# Patient Record
Sex: Male | Born: 1960
Health system: Southern US, Community
[De-identification: ages and names within clinical notes are randomized; demographics above are authoritative.]

## PROBLEM LIST (undated history)

## (undated) DIAGNOSIS — R001 Bradycardia, unspecified: Secondary | ICD-10-CM

## (undated) DIAGNOSIS — C61 Malignant neoplasm of prostate: Secondary | ICD-10-CM

## (undated) DIAGNOSIS — G4730 Sleep apnea, unspecified: Secondary | ICD-10-CM

## (undated) DIAGNOSIS — M1990 Unspecified osteoarthritis, unspecified site: Secondary | ICD-10-CM

## (undated) DIAGNOSIS — T4145XA Adverse effect of unspecified anesthetic, initial encounter: Secondary | ICD-10-CM

## (undated) DIAGNOSIS — Z8489 Family history of other specified conditions: Secondary | ICD-10-CM

## (undated) DIAGNOSIS — R112 Nausea with vomiting, unspecified: Secondary | ICD-10-CM

## (undated) DIAGNOSIS — Z87442 Personal history of urinary calculi: Secondary | ICD-10-CM

## (undated) DIAGNOSIS — K429 Umbilical hernia without obstruction or gangrene: Secondary | ICD-10-CM

## (undated) DIAGNOSIS — K219 Gastro-esophageal reflux disease without esophagitis: Secondary | ICD-10-CM

## (undated) DIAGNOSIS — Z9889 Other specified postprocedural states: Secondary | ICD-10-CM

## (undated) DIAGNOSIS — R350 Frequency of micturition: Secondary | ICD-10-CM

## (undated) DIAGNOSIS — Z889 Allergy status to unspecified drugs, medicaments and biological substances status: Secondary | ICD-10-CM

## (undated) DIAGNOSIS — R519 Headache, unspecified: Secondary | ICD-10-CM

## (undated) DIAGNOSIS — T8859XA Other complications of anesthesia, initial encounter: Secondary | ICD-10-CM

## (undated) HISTORY — PX: PROSTATE BIOPSY: SHX241

## (undated) HISTORY — PX: LITHOTRIPSY: SUR834

## (undated) HISTORY — PX: VASECTOMY: SHX75

---

## 2011-01-21 ENCOUNTER — Emergency Department (HOSPITAL_COMMUNITY)
Admission: EM | Admit: 2011-01-21 | Discharge: 2011-01-21 | Disposition: A | Discharge status: Home / Self Care | Payer: 59 | Source: Home / Self Care | Attending: Family Medicine | Admitting: Family Medicine

## 2011-01-21 ENCOUNTER — Emergency Department (INDEPENDENT_AMBULATORY_CARE_PROVIDER_SITE_OTHER): Payer: 59

## 2011-01-21 DIAGNOSIS — J069 Acute upper respiratory infection, unspecified: Secondary | ICD-10-CM

## 2011-01-21 MED ORDER — PSEUDOEPHEDRINE HCL ER 120 MG PO TB12: 120.0000 mg | ORAL_TABLET | Freq: Two times a day (BID) | ORAL | Status: DC

## 2011-01-21 NOTE — ED Provider Notes (Signed)
Medical screening examination/treatment/procedure(s) were performed by non-physician practitioner and as supervising physician I was immediately available for consultation/collaboration.   KINDL,JAMES DOUGLAS MD.    James Douglas Kindl, MD 01/21/11 2005 

## 2011-01-21 NOTE — ED Provider Notes (Signed)
History     CSN: [id number] Arrival date & time: 01/21/2011  9:18 AM   First MD Initiated Contact with Patient 01/21/11 0919      No chief complaint on file.   (Consider location/radiation/quality/duration/timing/severity/associated sxs/prior treatment) Patient is a 50 y.o. male presenting with cough.  Cough This is a new problem. Episode onset: 2.5 weeks ago. The problem occurs every few minutes. The problem has been gradually worsening. The cough is productive of sputum (thick yellow sputum). There has been no fever. Associated symptoms include chills, sore throat and wheezing. Pertinent negatives include no chest pain, no ear congestion, no ear pain, no rhinorrhea and no myalgias. Associated symptoms comments: Back pain . Treatments tried: tx by pcp at start of illness with z-pack, it helped a little but when finished the medicine, sx returned  The treatment provided no relief. He is not a smoker. His past medical history is significant for asthma.    No past medical history on file.  No past surgical history on file.  No family history on file.  History  Substance Use Topics  . Smoking status: Not on file  . Smokeless tobacco: Not on file  . Alcohol Use: Not on file      Review of Systems  Constitutional: Positive for chills. Negative for fever.  HENT: Positive for congestion, sore throat and postnasal drip. Negative for ear pain, rhinorrhea, trouble swallowing and sinus pressure.   Respiratory: Positive for cough and wheezing.   Cardiovascular: Negative for chest pain.  Musculoskeletal: Negative for myalgias.    Allergies  Review of patient's allergies indicates not on file.  Home Medications  No current outpatient prescriptions on file.  BP 121/74  Pulse 97  Temp(Src) 97.4 F (36.3 C) (Oral)  Resp 17  SpO2 98%  Physical Exam  Constitutional: He appears well-developed and well-nourished. No distress.  HENT:  Right Ear: Tympanic membrane, external ear and  ear canal normal.  Left Ear: Tympanic membrane, external ear and ear canal normal.  Nose: Mucosal edema present.  Mouth/Throat: Mucous membranes are normal. Posterior oropharyngeal erythema present. No oropharyngeal exudate or posterior oropharyngeal edema.       White nasal discharge  Cardiovascular: Normal rate and regular rhythm.   Pulmonary/Chest: Effort normal and breath sounds normal.       cough  Lymphadenopathy:       Head (right side): No submandibular adenopathy present.       Head (left side): No submandibular adenopathy present.    ED Course  Procedures (including critical care time)  Labs Reviewed - No data to display No results found.   No diagnosis found.    MDM  No pna on xray, mild peribronchial thickening        Cathlyn Parsons, NP 01/21/11 1026

## 2013-02-27 DIAGNOSIS — G4730 Sleep apnea, unspecified: Secondary | ICD-10-CM

## 2013-02-27 HISTORY — DX: Sleep apnea, unspecified: G47.30

## 2013-07-17 ENCOUNTER — Encounter: Payer: Self-pay | Admitting: Gastroenterology

## 2014-12-03 ENCOUNTER — Institutional Professional Consult (permissible substitution): Payer: 59 | Admitting: Pulmonary Disease

## 2015-03-16 MED FILL — FLUTICASONE PROP 50 MCG SPR: 50 | 89 days supply | Qty: 48 | Fill #2

## 2015-03-16 MED FILL — CYCLOBENZAPRINE 10 MG TAB: 10 | 30 days supply | Qty: 90 | Fill #1

## 2015-03-25 DIAGNOSIS — G4733 Obstructive sleep apnea (adult) (pediatric): Secondary | ICD-10-CM | POA: Diagnosis not present

## 2015-04-28 MED FILL — CYCLOBENZAPRINE 10 MG TAB: 10 | 30 days supply | Qty: 90 | Fill #2

## 2015-04-28 MED FILL — OMEPRAZOLE DR 20 MG CAPSULE: 20 | 90 days supply | Qty: 180 | Fill #1

## 2015-04-28 MED FILL — GABAPENTIN 300 MG CAPSULE: 300 | 90 days supply | Qty: 180 | Fill #1

## 2015-05-06 MED FILL — TAMSULOSIN HCL 0.4 MG CAP: 0.4 | 90 days supply | Qty: 90 | Fill #0

## 2015-05-06 MED FILL — MONTELUKAST SOD 10 MG TAB: 10 | 90 days supply | Qty: 90 | Fill #0

## 2015-06-15 DIAGNOSIS — G4733 Obstructive sleep apnea (adult) (pediatric): Secondary | ICD-10-CM | POA: Diagnosis not present

## 2015-06-24 DIAGNOSIS — G4733 Obstructive sleep apnea (adult) (pediatric): Secondary | ICD-10-CM | POA: Diagnosis not present

## 2015-07-13 DIAGNOSIS — G4733 Obstructive sleep apnea (adult) (pediatric): Secondary | ICD-10-CM | POA: Diagnosis not present

## 2015-07-13 DIAGNOSIS — Z9989 Dependence on other enabling machines and devices: Secondary | ICD-10-CM | POA: Diagnosis not present

## 2015-07-13 DIAGNOSIS — J3089 Other allergic rhinitis: Secondary | ICD-10-CM | POA: Diagnosis not present

## 2015-07-27 MED FILL — GABAPENTIN 300 MG CAPSULE: 300 | 90 days supply | Qty: 180 | Fill #2

## 2015-07-27 MED FILL — FLUTICASONE PROP 50 MCG SPR: 50 | 89 days supply | Qty: 48 | Fill #3

## 2015-07-27 MED FILL — TAMSULOSIN HCL 0.4 MG CAP: 0.4 | 90 days supply | Qty: 90 | Fill #1

## 2015-07-27 MED FILL — MONTELUKAST SOD 10 MG TAB: 10 | 90 days supply | Qty: 90 | Fill #1

## 2015-07-27 MED FILL — OMEPRAZOLE DR 20 MG CAPSULE: 20 | 90 days supply | Qty: 180 | Fill #2

## 2015-08-10 DIAGNOSIS — L820 Inflamed seborrheic keratosis: Secondary | ICD-10-CM | POA: Diagnosis not present

## 2015-08-10 DIAGNOSIS — D225 Melanocytic nevi of trunk: Secondary | ICD-10-CM | POA: Diagnosis not present

## 2015-08-10 DIAGNOSIS — D485 Neoplasm of uncertain behavior of skin: Secondary | ICD-10-CM | POA: Diagnosis not present

## 2015-08-10 DIAGNOSIS — D1801 Hemangioma of skin and subcutaneous tissue: Secondary | ICD-10-CM | POA: Diagnosis not present

## 2015-09-24 DIAGNOSIS — G4733 Obstructive sleep apnea (adult) (pediatric): Secondary | ICD-10-CM | POA: Diagnosis not present

## 2015-09-27 DIAGNOSIS — H5213 Myopia, bilateral: Secondary | ICD-10-CM | POA: Diagnosis not present

## 2015-10-08 MED FILL — CYCLOBENZAPRINE 10 MG TAB: 10 | 30 days supply | Qty: 90 | Fill #0

## 2015-10-26 MED FILL — GABAPENTIN 300 MG CAPSULE: 300 | 90 days supply | Qty: 180 | Fill #3

## 2015-10-26 MED FILL — TAMSULOSIN HCL 0.4 MG CAP: 0.4 | 90 days supply | Qty: 90 | Fill #2

## 2015-10-26 MED FILL — MONTELUKAST SOD 10 MG TAB: 10 | 90 days supply | Qty: 90 | Fill #2

## 2015-10-26 MED FILL — FLUTICASONE PROP 50 MCG SPR: 50 | 90 days supply | Qty: 48 | Fill #0

## 2015-10-26 MED FILL — OMEPRAZOLE DR 20 MG CAPSULE: 20 | 90 days supply | Qty: 180 | Fill #3

## 2015-12-06 DIAGNOSIS — R8299 Other abnormal findings in urine: Secondary | ICD-10-CM | POA: Diagnosis not present

## 2015-12-06 DIAGNOSIS — N390 Urinary tract infection, site not specified: Secondary | ICD-10-CM | POA: Diagnosis not present

## 2015-12-06 DIAGNOSIS — Z125 Encounter for screening for malignant neoplasm of prostate: Secondary | ICD-10-CM | POA: Diagnosis not present

## 2015-12-06 DIAGNOSIS — Z0000 Encounter for general adult medical examination without abnormal findings: Secondary | ICD-10-CM | POA: Diagnosis not present

## 2015-12-09 DIAGNOSIS — Z1212 Encounter for screening for malignant neoplasm of rectum: Secondary | ICD-10-CM | POA: Diagnosis not present

## 2015-12-27 DIAGNOSIS — R358 Other polyuria: Secondary | ICD-10-CM | POA: Diagnosis not present

## 2015-12-27 DIAGNOSIS — Z0000 Encounter for general adult medical examination without abnormal findings: Secondary | ICD-10-CM | POA: Diagnosis not present

## 2015-12-27 DIAGNOSIS — R001 Bradycardia, unspecified: Secondary | ICD-10-CM | POA: Diagnosis not present

## 2015-12-27 DIAGNOSIS — R319 Hematuria, unspecified: Secondary | ICD-10-CM | POA: Diagnosis not present

## 2015-12-27 DIAGNOSIS — Z8042 Family history of malignant neoplasm of prostate: Secondary | ICD-10-CM | POA: Diagnosis not present

## 2015-12-27 DIAGNOSIS — K219 Gastro-esophageal reflux disease without esophagitis: Secondary | ICD-10-CM | POA: Diagnosis not present

## 2015-12-27 DIAGNOSIS — Z1389 Encounter for screening for other disorder: Secondary | ICD-10-CM | POA: Diagnosis not present

## 2015-12-27 DIAGNOSIS — N400 Enlarged prostate without lower urinary tract symptoms: Secondary | ICD-10-CM | POA: Diagnosis not present

## 2015-12-27 DIAGNOSIS — G4733 Obstructive sleep apnea (adult) (pediatric): Secondary | ICD-10-CM | POA: Diagnosis not present

## 2015-12-27 DIAGNOSIS — E668 Other obesity: Secondary | ICD-10-CM | POA: Diagnosis not present

## 2015-12-27 DIAGNOSIS — R3121 Asymptomatic microscopic hematuria: Secondary | ICD-10-CM | POA: Diagnosis not present

## 2015-12-28 DIAGNOSIS — G4733 Obstructive sleep apnea (adult) (pediatric): Secondary | ICD-10-CM | POA: Diagnosis not present

## 2016-01-06 DIAGNOSIS — R3129 Other microscopic hematuria: Secondary | ICD-10-CM | POA: Diagnosis not present

## 2016-01-06 DIAGNOSIS — N200 Calculus of kidney: Secondary | ICD-10-CM | POA: Diagnosis not present

## 2016-01-06 DIAGNOSIS — N401 Enlarged prostate with lower urinary tract symptoms: Secondary | ICD-10-CM | POA: Diagnosis not present

## 2016-01-14 DIAGNOSIS — I2510 Atherosclerotic heart disease of native coronary artery without angina pectoris: Secondary | ICD-10-CM | POA: Diagnosis not present

## 2016-01-14 DIAGNOSIS — I700 Atherosclerosis of aorta: Secondary | ICD-10-CM | POA: Diagnosis not present

## 2016-01-14 DIAGNOSIS — K429 Umbilical hernia without obstruction or gangrene: Secondary | ICD-10-CM | POA: Diagnosis not present

## 2016-01-14 DIAGNOSIS — N200 Calculus of kidney: Secondary | ICD-10-CM | POA: Diagnosis not present

## 2016-01-14 DIAGNOSIS — N132 Hydronephrosis with renal and ureteral calculous obstruction: Secondary | ICD-10-CM | POA: Diagnosis not present

## 2016-01-18 DIAGNOSIS — R109 Unspecified abdominal pain: Secondary | ICD-10-CM | POA: Diagnosis not present

## 2016-01-18 DIAGNOSIS — N200 Calculus of kidney: Secondary | ICD-10-CM | POA: Diagnosis not present

## 2016-01-27 DIAGNOSIS — Z6833 Body mass index (BMI) 33.0-33.9, adult: Secondary | ICD-10-CM | POA: Diagnosis not present

## 2016-01-27 DIAGNOSIS — Z87891 Personal history of nicotine dependence: Secondary | ICD-10-CM | POA: Diagnosis not present

## 2016-01-27 DIAGNOSIS — J45909 Unspecified asthma, uncomplicated: Secondary | ICD-10-CM | POA: Diagnosis not present

## 2016-01-27 DIAGNOSIS — R310 Gross hematuria: Secondary | ICD-10-CM | POA: Diagnosis not present

## 2016-01-27 DIAGNOSIS — K219 Gastro-esophageal reflux disease without esophagitis: Secondary | ICD-10-CM | POA: Diagnosis not present

## 2016-01-27 DIAGNOSIS — G4730 Sleep apnea, unspecified: Secondary | ICD-10-CM | POA: Diagnosis not present

## 2016-01-27 DIAGNOSIS — I10 Essential (primary) hypertension: Secondary | ICD-10-CM | POA: Diagnosis not present

## 2016-01-27 DIAGNOSIS — N200 Calculus of kidney: Secondary | ICD-10-CM | POA: Diagnosis not present

## 2016-01-27 DIAGNOSIS — Z79899 Other long term (current) drug therapy: Secondary | ICD-10-CM | POA: Diagnosis not present

## 2016-01-27 DIAGNOSIS — R3129 Other microscopic hematuria: Secondary | ICD-10-CM | POA: Diagnosis not present

## 2016-01-27 HISTORY — PX: OTHER SURGICAL HISTORY: SHX169

## 2016-01-28 DIAGNOSIS — Z79899 Other long term (current) drug therapy: Secondary | ICD-10-CM | POA: Diagnosis not present

## 2016-01-28 DIAGNOSIS — N200 Calculus of kidney: Secondary | ICD-10-CM | POA: Diagnosis not present

## 2016-01-28 DIAGNOSIS — J45909 Unspecified asthma, uncomplicated: Secondary | ICD-10-CM | POA: Diagnosis not present

## 2016-01-28 DIAGNOSIS — N400 Enlarged prostate without lower urinary tract symptoms: Secondary | ICD-10-CM | POA: Diagnosis not present

## 2016-01-28 DIAGNOSIS — G4730 Sleep apnea, unspecified: Secondary | ICD-10-CM | POA: Diagnosis not present

## 2016-01-28 DIAGNOSIS — N2889 Other specified disorders of kidney and ureter: Secondary | ICD-10-CM | POA: Diagnosis not present

## 2016-02-07 DIAGNOSIS — N200 Calculus of kidney: Secondary | ICD-10-CM | POA: Diagnosis not present

## 2016-02-07 DIAGNOSIS — R109 Unspecified abdominal pain: Secondary | ICD-10-CM | POA: Diagnosis not present

## 2016-02-15 DIAGNOSIS — N200 Calculus of kidney: Secondary | ICD-10-CM | POA: Diagnosis not present

## 2016-02-18 MED FILL — TAMSULOSIN HCL 0.4 MG CAP: 0.4 | 90 days supply | Qty: 90 | Fill #3

## 2016-02-18 MED FILL — FLUTICASONE PROP 50 MCG SPR: 50 | 90 days supply | Qty: 48 | Fill #1

## 2016-02-18 MED FILL — GABAPENTIN 300 MG CAPSULE: 300 | 90 days supply | Qty: 180 | Fill #0

## 2016-02-18 MED FILL — MONTELUKAST SOD 10 MG TAB: 10 | 90 days supply | Qty: 90 | Fill #3

## 2016-02-18 MED FILL — OMEPRAZOLE DR 20 MG CAPSULE: 20 | 90 days supply | Qty: 180 | Fill #0

## 2016-02-18 MED FILL — CYCLOBENZAPRINE 10 MG TAB: 10 | 30 days supply | Qty: 90 | Fill #1

## 2016-02-22 MED FILL — HYDROCODON-APAP 7.5-325: 7.5-325 | 10 days supply | Qty: 30 | Fill #0

## 2016-02-23 DIAGNOSIS — N200 Calculus of kidney: Secondary | ICD-10-CM | POA: Diagnosis not present

## 2016-02-25 DIAGNOSIS — E669 Obesity, unspecified: Secondary | ICD-10-CM | POA: Diagnosis not present

## 2016-02-25 DIAGNOSIS — Z79899 Other long term (current) drug therapy: Secondary | ICD-10-CM | POA: Diagnosis not present

## 2016-02-25 DIAGNOSIS — K449 Diaphragmatic hernia without obstruction or gangrene: Secondary | ICD-10-CM | POA: Diagnosis not present

## 2016-02-25 DIAGNOSIS — N200 Calculus of kidney: Secondary | ICD-10-CM | POA: Diagnosis not present

## 2016-02-25 DIAGNOSIS — G4730 Sleep apnea, unspecified: Secondary | ICD-10-CM | POA: Diagnosis not present

## 2016-02-25 DIAGNOSIS — J449 Chronic obstructive pulmonary disease, unspecified: Secondary | ICD-10-CM | POA: Diagnosis not present

## 2016-03-03 DIAGNOSIS — N200 Calculus of kidney: Secondary | ICD-10-CM | POA: Diagnosis not present

## 2016-03-03 DIAGNOSIS — N202 Calculus of kidney with calculus of ureter: Secondary | ICD-10-CM | POA: Diagnosis not present

## 2016-03-03 DIAGNOSIS — R109 Unspecified abdominal pain: Secondary | ICD-10-CM | POA: Diagnosis not present

## 2016-03-06 MED FILL — URO-MP CAPSULE: 118 | 10 days supply | Qty: 30 | Fill #0

## 2016-03-13 MED FILL — HYDROCODON-APAP 7.5-325: 7.5-325 | 10 days supply | Qty: 30 | Fill #0

## 2016-03-21 MED FILL — URO-MP CAPSULE: 118 | 10 days supply | Qty: 30 | Fill #1

## 2016-03-28 DIAGNOSIS — N200 Calculus of kidney: Secondary | ICD-10-CM | POA: Diagnosis not present

## 2016-04-03 DIAGNOSIS — G4733 Obstructive sleep apnea (adult) (pediatric): Secondary | ICD-10-CM | POA: Diagnosis not present

## 2016-04-06 DIAGNOSIS — N200 Calculus of kidney: Secondary | ICD-10-CM | POA: Diagnosis not present

## 2016-04-07 ENCOUNTER — Other Ambulatory Visit: Payer: Self-pay | Admitting: Urology

## 2016-04-11 ENCOUNTER — Other Ambulatory Visit: Payer: Self-pay | Admitting: Urology

## 2016-04-12 MED ORDER — DEXTROSE 5 % IV SOLN: 1.0000 g | Freq: Three times a day (TID) | INTRAVENOUS | Status: DC

## 2016-04-25 NOTE — Patient Instructions (Signed)
Gregory [name]  04/25/2016   Your procedure is scheduled on: 05-01-16  Report to [facility]  Entrance take [facility]  elevators to 3rd floor to  [place] at 1015AM.  Call this number if you have problems the morning of surgery [phone]   Remember: ONLY 1 PERSON MAY GO WITH YOU TO SHORT STAY TO GET  READY MORNING OF [place].  Do not eat food or drink liquids :After Midnight.     Take these medicines the morning of surgery with A SIP OF WATER: mucinex as needed, hydrocodone as needed, omeprazole(prilosec), tamsulosin(flomax), tylenol as needed, nasal spray as needed                                 You may not have any metal on your body including hair pins and              piercings  Do not wear jewelry, make-up, lotions, powders or perfumes, deodorant             Do not wear nail polish.  Do not shave  48 hours prior to surgery.              Men may shave face and neck.   Do not bring valuables to the hospital. [place].  Contacts, dentures or bridgework may not be worn into surgery.  Leave suitcase in the car. After surgery it may be brought to your room.               Please read over the following fact sheets you were given: _____________________________________________________________________             [facility] - Preparing for Surgery Before surgery, you can play an important role.  Because skin is not sterile, your skin needs to be as free of germs as possible.  You can reduce the number of germs on your skin by washing with CHG (chlorahexidine gluconate) soap before surgery.  CHG is an antiseptic cleaner which kills germs and bonds with the skin to continue killing germs even after washing. Please DO NOT use if you have an allergy to CHG or antibacterial soaps.  If your skin becomes reddened/irritated stop using the CHG and inform your nurse when you arrive at Short Stay. Do  not shave (including legs and underarms) for at least 48 hours prior to the first CHG shower.  You may shave your face/neck. Please follow these instructions carefully:  1.  Shower with CHG Soap the night before surgery and the  morning of Surgery.  2.  If you choose to wash your hair, wash your hair first as usual with your  normal  shampoo.  3.  After you shampoo, rinse your hair and body thoroughly to remove the  shampoo.                           4.  Use CHG as you would any other liquid soap.  You can apply chg directly  to the skin and wash                       Gently with a  scrungie or clean washcloth.  5.  Apply the CHG Soap to your body ONLY FROM THE NECK DOWN.   Do not use on face/ open                           Wound or open sores. Avoid contact with eyes, ears mouth and genitals (private parts).                       Wash face,  Genitals (private parts) with your normal soap.             6.  Wash thoroughly, paying special attention to the area where your surgery  will be performed.  7.  Thoroughly rinse your body with warm water from the neck down.  8.  DO NOT shower/wash with your normal soap after using and rinsing off  the CHG Soap.                9.  Pat yourself dry with a clean towel.            10.  Wear clean pajamas.            11.  Place clean sheets on your bed the night of your first shower and do not  sleep with pets. Day of Surgery : Do not apply any lotions/deodorants the morning of surgery.  Please wear clean clothes to the hospital/surgery center.  FAILURE TO FOLLOW THESE INSTRUCTIONS MAY RESULT IN THE CANCELLATION OF YOUR SURGERY PATIENT SIGNATURE_________________________________  NURSE SIGNATURE__________________________________  ________________________________________________________________________

## 2016-04-26 ENCOUNTER — Encounter (HOSPITAL_COMMUNITY): Payer: Self-pay

## 2016-04-26 ENCOUNTER — Encounter (HOSPITAL_COMMUNITY)
Admission: RE | Admit: 2016-04-26 | Discharge: 2016-04-26 | Disposition: A | Discharge status: Home / Self Care | Payer: 59 | Source: Ambulatory Visit | Attending: Urology | Admitting: Urology

## 2016-04-26 ENCOUNTER — Encounter (INDEPENDENT_AMBULATORY_CARE_PROVIDER_SITE_OTHER): Payer: Self-pay

## 2016-04-26 DIAGNOSIS — N200 Calculus of kidney: Secondary | ICD-10-CM | POA: Diagnosis not present

## 2016-04-26 DIAGNOSIS — Z01812 Encounter for preprocedural laboratory examination: Secondary | ICD-10-CM | POA: Insufficient documentation

## 2016-04-26 HISTORY — DX: Unspecified osteoarthritis, unspecified site: M19.90

## 2016-04-26 HISTORY — DX: Other complications of anesthesia, initial encounter: T88.59XA

## 2016-04-26 HISTORY — DX: Gastro-esophageal reflux disease without esophagitis: K21.9

## 2016-04-26 HISTORY — DX: Sleep apnea, unspecified: G47.30

## 2016-04-26 HISTORY — DX: Adverse effect of unspecified anesthetic, initial encounter: T41.45XA

## 2016-04-26 HISTORY — DX: Bradycardia, unspecified: R00.1

## 2016-04-26 HISTORY — DX: Frequency of micturition: R35.0

## 2016-04-26 LAB — BASIC METABOLIC PANEL
Anion gap: 8 (ref 5–15)
BUN: 13 mg/dL (ref 6–20)
CO2: 28 mmol/L (ref 22–32)
Calcium: 9.4 mg/dL (ref 8.9–10.3)
Chloride: 106 mmol/L (ref 101–111)
Creatinine, Ser: 1.05 mg/dL (ref 0.61–1.24)
GLUCOSE: 102 mg/dL — AB (ref 65–99)
POTASSIUM: 4.6 mmol/L (ref 3.5–5.1)
Sodium: 142 mmol/L (ref 135–145)

## 2016-04-26 LAB — CBC
HEMATOCRIT: 41.7 % (ref 39.0–52.0)
HEMOGLOBIN: 14.2 g/dL (ref 13.0–17.0)
MCH: 30.7 pg (ref 26.0–34.0)
MCHC: 34.1 g/dL (ref 30.0–36.0)
MCV: 90.3 fL (ref 78.0–100.0)
Platelets: 192 10*3/uL (ref 150–400)
RBC: 4.62 MIL/uL (ref 4.22–5.81)
RDW: 13 % (ref 11.5–15.5)
WBC: 6.7 10*3/uL (ref 4.0–10.5)

## 2016-04-26 NOTE — Progress Notes (Signed)
EKG 12-27-15 on chart LOV pulm 07-13-15 on chart

## 2016-04-27 ENCOUNTER — Encounter (HOSPITAL_COMMUNITY): Payer: Self-pay

## 2016-05-01 ENCOUNTER — Ambulatory Visit (HOSPITAL_COMMUNITY): Payer: 59

## 2016-05-01 ENCOUNTER — Observation Stay (HOSPITAL_COMMUNITY)
Admission: RE | Admit: 2016-05-01 | Discharge: 2016-05-02 | Disposition: A | Discharge status: Home / Self Care | Payer: 59 | Source: Ambulatory Visit | Attending: Urology | Admitting: Urology

## 2016-05-01 ENCOUNTER — Encounter (HOSPITAL_COMMUNITY): Payer: Self-pay

## 2016-05-01 ENCOUNTER — Ambulatory Visit (HOSPITAL_COMMUNITY): Payer: 59 | Admitting: Registered Nurse

## 2016-05-01 ENCOUNTER — Observation Stay (HOSPITAL_COMMUNITY): Payer: 59

## 2016-05-01 ENCOUNTER — Encounter (HOSPITAL_COMMUNITY)
Admission: RE | Disposition: A | Discharge status: Home / Self Care | Payer: Self-pay | Source: Ambulatory Visit | Attending: Urology

## 2016-05-01 DIAGNOSIS — Z419 Encounter for procedure for purposes other than remedying health state, unspecified: Secondary | ICD-10-CM

## 2016-05-01 DIAGNOSIS — Z7982 Long term (current) use of aspirin: Secondary | ICD-10-CM | POA: Diagnosis not present

## 2016-05-01 DIAGNOSIS — K219 Gastro-esophageal reflux disease without esophagitis: Secondary | ICD-10-CM | POA: Diagnosis not present

## 2016-05-01 DIAGNOSIS — G4730 Sleep apnea, unspecified: Secondary | ICD-10-CM | POA: Diagnosis not present

## 2016-05-01 DIAGNOSIS — Z7951 Long term (current) use of inhaled steroids: Secondary | ICD-10-CM | POA: Insufficient documentation

## 2016-05-01 DIAGNOSIS — N200 Calculus of kidney: Principal | ICD-10-CM

## 2016-05-01 DIAGNOSIS — J948 Other specified pleural conditions: Secondary | ICD-10-CM | POA: Diagnosis not present

## 2016-05-01 DIAGNOSIS — G4733 Obstructive sleep apnea (adult) (pediatric): Secondary | ICD-10-CM | POA: Insufficient documentation

## 2016-05-01 DIAGNOSIS — Z960 Presence of urogenital implants: Secondary | ICD-10-CM | POA: Diagnosis not present

## 2016-05-01 HISTORY — PX: NEPHROLITHOTOMY: SHX5134

## 2016-05-01 HISTORY — PX: CYSTOSCOPY W/ RETROGRADES: SHX1426

## 2016-05-01 LAB — BASIC METABOLIC PANEL
ANION GAP: 7 (ref 5–15)
BUN: 14 mg/dL (ref 6–20)
CALCIUM: 8.5 mg/dL — AB (ref 8.9–10.3)
CO2: 24 mmol/L (ref 22–32)
Chloride: 109 mmol/L (ref 101–111)
Creatinine, Ser: 1.27 mg/dL — ABNORMAL HIGH (ref 0.61–1.24)
GFR calc Af Amer: >60 mL/min (ref >60)
GLUCOSE: 147 mg/dL — AB (ref 65–99)
POTASSIUM: 3.5 mmol/L (ref 3.5–5.1)
SODIUM: 140 mmol/L (ref 135–145)

## 2016-05-01 LAB — CBC
HCT: 39.6 % (ref 39.0–52.0)
Hemoglobin: 13.5 g/dL (ref 13.0–17.0)
MCH: 30.8 pg (ref 26.0–34.0)
MCHC: 34.1 g/dL (ref 30.0–36.0)
MCV: 90.4 fL (ref 78.0–100.0)
PLATELETS: 177 10*3/uL (ref 150–400)
RBC: 4.38 MIL/uL (ref 4.22–5.81)
RDW: 13.2 % (ref 11.5–15.5)
WBC: 12 10*3/uL — AB (ref 4.0–10.5)

## 2016-05-01 LAB — GLUCOSE, CAPILLARY: GLUCOSE-CAPILLARY: 159 mg/dL — AB (ref 65–99)

## 2016-05-01 SURGERY — NEPHROLITHOTOMY PERCUTANEOUS
Anesthesia: General | Laterality: Right

## 2016-05-01 MED ORDER — TAMSULOSIN HCL 0.4 MG PO CAPS
0.4000 mg | ORAL_CAPSULE | Freq: Two times a day (BID) | ORAL | Status: DC
  Administered 2016-05-01 – 2016-05-02 (×2): 0.4 mg via ORAL
  Filled 2016-05-01 (×2): qty 1

## 2016-05-01 MED ORDER — PROPOFOL 10 MG/ML IV BOLUS
INTRAVENOUS | Status: AC
  Filled 2016-05-01: qty 20

## 2016-05-01 MED ORDER — SUGAMMADEX SODIUM 500 MG/5ML IV SOLN
INTRAVENOUS | Status: AC
  Filled 2016-05-01: qty 5

## 2016-05-01 MED ORDER — OXYCODONE HCL 5 MG PO TABS
5.0000 mg | ORAL_TABLET | ORAL | Status: DC | PRN
  Administered 2016-05-01 – 2016-05-02 (×4): 5 mg via ORAL
  Filled 2016-05-01 (×4): qty 1

## 2016-05-01 MED ORDER — HYDROMORPHONE HCL 1 MG/ML IJ SOLN: 0.2500 mg | INTRAMUSCULAR | Status: DC | PRN

## 2016-05-01 MED ORDER — IOHEXOL 300 MG/ML  SOLN
INTRAMUSCULAR | Status: DC | PRN
  Administered 2016-05-01: 80 mL

## 2016-05-01 MED ORDER — FENTANYL CITRATE (PF) 100 MCG/2ML IJ SOLN
INTRAMUSCULAR | Status: DC | PRN
  Administered 2016-05-01 (×2): 50 ug via INTRAVENOUS

## 2016-05-01 MED ORDER — MIDAZOLAM HCL 5 MG/5ML IJ SOLN
INTRAMUSCULAR | Status: DC | PRN
  Administered 2016-05-01: 2 mg via INTRAVENOUS

## 2016-05-01 MED ORDER — SUCCINYLCHOLINE CHLORIDE 200 MG/10ML IV SOSY
PREFILLED_SYRINGE | INTRAVENOUS | Status: DC | PRN
  Administered 2016-05-01: 140 mg via INTRAVENOUS

## 2016-05-01 MED ORDER — ZOLPIDEM TARTRATE 5 MG PO TABS
5.0000 mg | ORAL_TABLET | Freq: Every evening | ORAL | Status: DC | PRN
Start: 2016-05-01 — End: 2016-05-02

## 2016-05-01 MED ORDER — ONDANSETRON HCL 4 MG/2ML IJ SOLN
INTRAMUSCULAR | Status: AC
  Filled 2016-05-01: qty 2

## 2016-05-01 MED ORDER — EPHEDRINE SULFATE-NACL 50-0.9 MG/10ML-% IV SOSY
PREFILLED_SYRINGE | INTRAVENOUS | Status: DC | PRN
  Administered 2016-05-01 (×4): 5 mg via INTRAVENOUS
  Administered 2016-05-01 (×3): 10 mg via INTRAVENOUS

## 2016-05-01 MED ORDER — BELLADONNA ALKALOIDS-OPIUM 16.2-60 MG RE SUPP
1.0000 | Freq: Four times a day (QID) | RECTAL | Status: DC | PRN
  Administered 2016-05-01: 1 via RECTAL
  Filled 2016-05-01: qty 1

## 2016-05-01 MED ORDER — PHENYLEPHRINE 40 MCG/ML (10ML) SYRINGE FOR IV PUSH (FOR BLOOD PRESSURE SUPPORT)
PREFILLED_SYRINGE | INTRAVENOUS | Status: DC | PRN
  Administered 2016-05-01: 40 ug via INTRAVENOUS

## 2016-05-01 MED ORDER — FENTANYL CITRATE (PF) 250 MCG/5ML IJ SOLN
INTRAMUSCULAR | Status: AC
  Filled 2016-05-01: qty 5

## 2016-05-01 MED ORDER — PANTOPRAZOLE SODIUM 40 MG PO TBEC
40.0000 mg | DELAYED_RELEASE_TABLET | Freq: Every day | ORAL | Status: DC
  Filled 2016-05-01: qty 1

## 2016-05-01 MED ORDER — LIDOCAINE 2% (20 MG/ML) 5 ML SYRINGE
INTRAMUSCULAR | Status: AC
  Filled 2016-05-01: qty 5

## 2016-05-01 MED ORDER — ROCURONIUM BROMIDE 50 MG/5ML IV SOSY
PREFILLED_SYRINGE | INTRAVENOUS | Status: AC
  Filled 2016-05-01: qty 5

## 2016-05-01 MED ORDER — DEXAMETHASONE SODIUM PHOSPHATE 10 MG/ML IJ SOLN
INTRAMUSCULAR | Status: AC
  Filled 2016-05-01: qty 1

## 2016-05-01 MED ORDER — CYCLOBENZAPRINE HCL 10 MG PO TABS: 10.0000 mg | ORAL_TABLET | Freq: Three times a day (TID) | ORAL | Status: DC | PRN

## 2016-05-01 MED ORDER — HYDROMORPHONE HCL 1 MG/ML IJ SOLN
0.5000 mg | INTRAMUSCULAR | Status: DC | PRN
  Administered 2016-05-01 (×2): 1 mg via INTRAVENOUS
  Filled 2016-05-01 (×2): qty 1

## 2016-05-01 MED ORDER — INSULIN ASPART 100 UNIT/ML ~~LOC~~ SOLN
0.0000 [IU] | Freq: Three times a day (TID) | SUBCUTANEOUS | Status: DC
  Administered 2016-05-02 (×2): 2 [IU] via SUBCUTANEOUS

## 2016-05-01 MED ORDER — SUGAMMADEX SODIUM 200 MG/2ML IV SOLN
INTRAVENOUS | Status: DC | PRN
  Administered 2016-05-01: 500 mg via INTRAVENOUS

## 2016-05-01 MED ORDER — ACETAMINOPHEN 325 MG PO TABS
650.0000 mg | ORAL_TABLET | ORAL | Status: DC | PRN
Start: 2016-05-01 — End: 2016-05-02

## 2016-05-01 MED ORDER — GABAPENTIN 300 MG PO CAPS
300.0000 mg | ORAL_CAPSULE | Freq: Every day | ORAL | Status: DC
  Administered 2016-05-01: 300 mg via ORAL
  Filled 2016-05-01: qty 1

## 2016-05-01 MED ORDER — ONDANSETRON HCL 4 MG/2ML IJ SOLN: 4.0000 mg | INTRAMUSCULAR | Status: DC | PRN

## 2016-05-01 MED ORDER — ROCURONIUM BROMIDE 10 MG/ML (PF) SYRINGE
PREFILLED_SYRINGE | INTRAVENOUS | Status: DC | PRN
  Administered 2016-05-01 (×2): 10 mg via INTRAVENOUS
  Administered 2016-05-01: 50 mg via INTRAVENOUS

## 2016-05-01 MED ORDER — SODIUM CHLORIDE 0.9 % IR SOLN
Status: DC | PRN
Start: 2016-05-01 — End: 2016-05-01
  Administered 2016-05-01: 16000 mL

## 2016-05-01 MED ORDER — ONDANSETRON HCL 4 MG/2ML IJ SOLN
INTRAMUSCULAR | Status: DC | PRN
  Administered 2016-05-01: 4 mg via INTRAVENOUS

## 2016-05-01 MED ORDER — SODIUM CHLORIDE 0.9 % IV SOLN
INTRAVENOUS | Status: DC
  Administered 2016-05-01 – 2016-05-02 (×2): via INTRAVENOUS

## 2016-05-01 MED ORDER — SUCCINYLCHOLINE CHLORIDE 200 MG/10ML IV SOSY
PREFILLED_SYRINGE | INTRAVENOUS | Status: AC
  Filled 2016-05-01: qty 10

## 2016-05-01 MED ORDER — LIDOCAINE 2% (20 MG/ML) 5 ML SYRINGE
INTRAMUSCULAR | Status: DC | PRN
  Administered 2016-05-01: 100 mg via INTRAVENOUS

## 2016-05-01 MED ORDER — DIPHENHYDRAMINE HCL 12.5 MG/5ML PO ELIX: 12.5000 mg | ORAL_SOLUTION | Freq: Four times a day (QID) | ORAL | Status: DC | PRN

## 2016-05-01 MED ORDER — LACTATED RINGERS IV SOLN
INTRAVENOUS | Status: DC
  Administered 2016-05-01: 1000 mL via INTRAVENOUS

## 2016-05-01 MED ORDER — PROPOFOL 10 MG/ML IV BOLUS
INTRAVENOUS | Status: DC | PRN
  Administered 2016-05-01: 170 mg via INTRAVENOUS

## 2016-05-01 MED ORDER — GLYCOPYRROLATE 0.2 MG/ML IV SOSY
PREFILLED_SYRINGE | INTRAVENOUS | Status: DC | PRN
  Administered 2016-05-01: .2 mg via INTRAVENOUS

## 2016-05-01 MED ORDER — DEXTROSE 5 % IV SOLN
5.0000 mg/kg | Freq: Once | INTRAVENOUS | Status: DC
  Filled 2016-05-01: qty 12

## 2016-05-01 MED ORDER — MONTELUKAST SODIUM 10 MG PO TABS
10.0000 mg | ORAL_TABLET | Freq: Every day | ORAL | Status: DC
  Administered 2016-05-01: 10 mg via ORAL
  Filled 2016-05-01: qty 1

## 2016-05-01 MED ORDER — MIDAZOLAM HCL 2 MG/2ML IJ SOLN
INTRAMUSCULAR | Status: AC
  Filled 2016-05-01: qty 2

## 2016-05-01 MED ORDER — PROMETHAZINE HCL 25 MG/ML IJ SOLN: 6.2500 mg | INTRAMUSCULAR | Status: DC | PRN

## 2016-05-01 MED ORDER — GLYCOPYRROLATE 0.2 MG/ML IV SOSY
PREFILLED_SYRINGE | INTRAVENOUS | Status: AC
  Filled 2016-05-01: qty 3

## 2016-05-01 MED ORDER — EPHEDRINE 5 MG/ML INJ
INTRAVENOUS | Status: AC
  Filled 2016-05-01: qty 10

## 2016-05-01 MED ORDER — GENTAMICIN SULFATE 40 MG/ML IJ SOLN
5.0000 mg/kg | INTRAVENOUS | Status: AC
  Administered 2016-05-01: 480 mg via INTRAVENOUS
  Filled 2016-05-01: qty 12

## 2016-05-01 MED ORDER — DIPHENHYDRAMINE HCL 50 MG/ML IJ SOLN: 12.5000 mg | Freq: Four times a day (QID) | INTRAMUSCULAR | Status: DC | PRN

## 2016-05-01 SURGICAL SUPPLY — 62 items
BAG URINE DRAINAGE (UROLOGICAL SUPPLIES) ×2 IMPLANT
BASKET STONE NITINOL 3FRX115MB (UROLOGICAL SUPPLIES) IMPLANT
BASKET ZERO TIP NITINOL 2.4FR (BASKET) IMPLANT
BENZOIN TINCTURE PRP APPL 2/3 (GAUZE/BANDAGES/DRESSINGS) ×4 IMPLANT
BLADE SURG 15 STRL LF DISP TIS (BLADE) ×1 IMPLANT
BLADE SURG 15 STRL SS (BLADE) ×1
CATH FOLEY 2W COUNCIL 20FR 5CC (CATHETERS) ×2 IMPLANT
CATH FOLEY 2WAY SLVR  5CC 16FR (CATHETERS) ×1
CATH FOLEY 2WAY SLVR  5CC 18FR (CATHETERS) ×1
CATH FOLEY 2WAY SLVR 5CC 16FR (CATHETERS) ×1 IMPLANT
CATH FOLEY 2WAY SLVR 5CC 18FR (CATHETERS) ×1 IMPLANT
CATH IMAGER II 65CM (CATHETERS) ×2 IMPLANT
CATH INTERMIT  6FR 70CM (CATHETERS) ×2 IMPLANT
CATH ROBINSON RED A/P 20FR (CATHETERS) IMPLANT
CATH URET DUAL LUMEN 6-10FR 50 (CATHETERS) ×2 IMPLANT
CATH X-FORCE N30 NEPHROSTOMY (TUBING) ×4 IMPLANT
COVER SURGICAL LIGHT HANDLE (MISCELLANEOUS) IMPLANT
DRAPE C-ARM 42X120 X-RAY (DRAPES) ×2 IMPLANT
DRAPE LINGEMAN PERC (DRAPES) ×2 IMPLANT
DRAPE SHEET LG 3/4 BI-LAMINATE (DRAPES) ×2 IMPLANT
DRAPE SURG IRRIG POUCH 19X23 (DRAPES) ×2 IMPLANT
DRSG PAD ABDOMINAL 8X10 ST (GAUZE/BANDAGES/DRESSINGS) IMPLANT
DRSG TEGADERM 8X12 (GAUZE/BANDAGES/DRESSINGS) IMPLANT
FIBER LASER FLEXIVA 1000 (UROLOGICAL SUPPLIES) IMPLANT
FIBER LASER FLEXIVA 365 (UROLOGICAL SUPPLIES) IMPLANT
FIBER LASER FLEXIVA 550 (UROLOGICAL SUPPLIES) IMPLANT
FIBER LASER TRAC TIP (UROLOGICAL SUPPLIES) IMPLANT
GAUZE SPONGE 4X4 12PLY STRL (GAUZE/BANDAGES/DRESSINGS) IMPLANT
GLOVE BIO SURGEON STRL SZ8 (GLOVE) ×6 IMPLANT
GLOVE BIOGEL PI IND STRL 8 (GLOVE) IMPLANT
GLOVE BIOGEL PI INDICATOR 8 (GLOVE)
GOWN STRL REUS W/TWL LRG LVL3 (GOWN DISPOSABLE) ×4 IMPLANT
GOWN STRL REUS W/TWL XL LVL3 (GOWN DISPOSABLE) ×2 IMPLANT
GUIDEWIRE AMPLAZ .035X145 (WIRE) ×4 IMPLANT
GUIDEWIRE STR DUAL SENSOR (WIRE) ×6 IMPLANT
HOLDER NEEDLE AMPLATZ W/INSERT (MISCELLANEOUS) ×2 IMPLANT
IV SET EXTENSION CATH 6 NF (IV SETS) ×2 IMPLANT
KIT BASIN OR (CUSTOM PROCEDURE TRAY) ×2 IMPLANT
MANIFOLD NEPTUNE II (INSTRUMENTS) ×2 IMPLANT
NEEDLE TROCAR 18X15 ECHO (NEEDLE) ×2 IMPLANT
NEEDLE TROCAR 18X20 (NEEDLE) ×2 IMPLANT
NS IRRIG 1000ML POUR BTL (IV SOLUTION) IMPLANT
PACK CYSTO (CUSTOM PROCEDURE TRAY) ×2 IMPLANT
PROBE LITHOCLAST ULTRA 3.8X403 (UROLOGICAL SUPPLIES) ×2 IMPLANT
PROBE PNEUMATIC 1.0MMX570MM (UROLOGICAL SUPPLIES) ×2 IMPLANT
SET AMPLATZ RENAL DILATOR (MISCELLANEOUS) IMPLANT
SET IRRIG Y TYPE TUR BLADDER L (SET/KITS/TRAYS/PACK) ×2 IMPLANT
SHEATH PEELAWAY SET 9 (SHEATH) ×2 IMPLANT
SPONGE LAP 4X18 X RAY DECT (DISPOSABLE) ×2 IMPLANT
STENT CONTOUR 6FRX26X.038 (STENTS) ×2 IMPLANT
STONE CATCHER W/TUBE ADAPTER (UROLOGICAL SUPPLIES) ×2 IMPLANT
SURGIFLO W/THROMBIN 8M KIT (HEMOSTASIS) ×2 IMPLANT
SUT SILK 2 0 30  PSL (SUTURE) ×2
SUT SILK 2 0 30 PSL (SUTURE) ×2 IMPLANT
SUT VIC AB 2-0 SH 27 (SUTURE) ×1
SUT VIC AB 2-0 SH 27X BRD (SUTURE) ×1 IMPLANT
SYR 10ML LL (SYRINGE) ×2 IMPLANT
SYR 20CC LL (SYRINGE) ×4 IMPLANT
SYR 50ML LL SCALE MARK (SYRINGE) ×2 IMPLANT
TOWEL OR 17X26 10 PK STRL BLUE (TOWEL DISPOSABLE) ×2 IMPLANT
TUBING CONNECTING 10 (TUBING) ×4 IMPLANT
WATER STERILE IRR 1500ML POUR (IV SOLUTION) IMPLANT

## 2016-05-01 NOTE — Op Note (Signed)
Preoperative diagnosis: Right renal stone  Postoperative diagnosis: Same  Procedure 1.  Right percutaneous nephrostolithotomy for stone greater than 2 cm 2.  Right nephrostogram 3.  Intraoperative fluoroscopy, under 1 hour, with interpretation 4.  Placement of a 6 x 26 double-J ureteral stent. 5.  Percutaneous access into the Right renal collecting system 6.  Placement of a 64 French nephrostomy tube 7.  Dilation of percutaneous tract  Attending: Dr. Alyson Ingles  Anesthesia: General  Estimated blood loss: 500cc  Antibiotics: gentamicin  Drains: 1.  16 French Foley catheter 2.  6 x 26 Right double-J ureteral stent 3.  20 French nephrostomy tube  Specimens: Stone for analysis  Findings: Numerous right calculi. 3 stones over 2cm  Indications: Patient is a 56 year old male with a history of large right renal calculi who has failed ESWL.  After discussing treatment options and they decided to proceed with right percutaneous nephrostolithotomy.    Procedure in detail: Prior to procedure consent was obtained.  Patient was brought to the operating room and a timeout was done to ensure correct patient, correct procedure, and correct site.  General anesthesia was administered. The patients genetalia was prepped and draped. A flexible cystoscope was passed into the urethra and then the bladder. No masses or lesions were seen in the bladder. Ureteral orifices were in the normal anatomic location. Using a grasper the right ureteral stent was brought to the urethral meatus. A sensor wire was advanced through the stent and up to the renal pelvis. The stent was removed and A 6 french ureteral catheter was advanced over the wire and up to the renal pelvis.  the wire was then removed and so was the cystoscope.  A 16 French Foley catheter was in place and the ureteral catheter was secured with a 0 silk tie.  The patient was then placed in the prone position.   The right flank was then prepped and draped in  usual sterile fashion.  Contrast was instilled throught the ureteral catheter and the bullseye technique was used to gain access into the lower pole. Once we gained access a sensor wire was advanced through the spinal needle and down the ureter to the bladder. We then used an 8 french and 10 french dilater to dilate the tract. We then used a sheath to place a second wire down to the bladder.  We then made an incision at the level of the skin and over the wire we then placed a NephroMax dilator.  We dilated the nephrostomy tract to 30 [place] and held this 18 cm of water for 1 minute.  We then  placed the access sheath over the balloon.  The balloon was then deflated.  We then used a rigid nephroscope to perform nephroscopy.  We encountered a large lower pole stone. Using the LithoClast the stone was fragmented and the fragments were removed with graspers.  The stone fragments were sent for composition analysis.  We then removed the nephroscope and over the wire placed a 6 x 26 double-J ureteral stent.  The wire was then removed and good coil was noted in the renal pelvis under direct vision in the bladder under fluoroscopy.  We then placed a 20 French nephrostomy tube through the sheath into the renal pelvis.  The balloon was inflated with 3 mls of contrast.  We then removed the access sheath in and obtained another nephrostogram.  We noted minimal extravasation of contrast.  We then secured the nephrostomy tube with 0 silks in  interrupted fashion.  Dressing was placed over the nephrostomy tube site and this then concluded the procedure was well-tolerated by the patient.  Complications: None  Condition: Stable, extubated, transferred to PACU  Plan: Patient is to be admitted overnight for observation.  Foley catheter through the morning.  Pt is then to be discharged home and followup in 1 week for nephrostomy tube removal. Stent to be removed 1 week after nephrostomy tube

## 2016-05-01 NOTE — Anesthesia Postprocedure Evaluation (Addendum)
Anesthesia Post Note  Patient: Gregory [name]  Procedure(s) Performed: Procedure(s) (LRB): NEPHROLITHOTOMY RIGHT PERCUTANEOUS WITH PHYSICIAN'S ACCESS,STENT (Right) CYSTOSCOPY WITH RETROGRADE PYELOGRAM (Right)  Patient location during evaluation: PACU Anesthesia Type: General Level of consciousness: awake and alert Pain management: pain level controlled Vital Signs Assessment: post-procedure vital signs reviewed and stable Respiratory status: spontaneous breathing, nonlabored ventilation, respiratory function stable and patient connected to nasal cannula oxygen Cardiovascular status: blood pressure returned to baseline and stable Postop Assessment: no signs of nausea or vomiting Anesthetic complications: no       Last Vitals:  Vitals:   05/01/16 1630 05/01/16 1645  BP: 123/74 129/73  Pulse: 80 73  Resp: 12 16  Temp:      Last Pain:  Vitals:   05/01/16 1031  TempSrc:   PainSc: 6                  Carlotta Telfair S

## 2016-05-01 NOTE — Anesthesia Procedure Notes (Signed)
Procedure Name: Intubation Date/Time: 05/01/2016 12:50 PM Performed by: Carleene Cooper A Pre-anesthesia Checklist: Patient identified, Emergency Drugs available, Suction available, Patient being monitored and Timeout performed Patient Re-evaluated:Patient Re-evaluated prior to inductionOxygen Delivery Method: Circle system utilized Preoxygenation: Pre-oxygenation with 100% oxygen Intubation Type: IV induction Ventilation: Mask ventilation without difficulty Laryngoscope Size: Mac and 4 Grade View: Grade II Tube type: Oral Tube size: 7.5 mm Number of attempts: 1 Airway Equipment and Method: Stylet Placement Confirmation: ETT inserted through vocal cords under direct vision,  positive ETCO2 and breath sounds checked- equal and bilateral Secured at: 22 cm Tube secured with: Tape Dental Injury: Teeth and Oropharynx as per pre-operative assessment

## 2016-05-01 NOTE — Transfer of Care (Signed)
Immediate Anesthesia Transfer of Care Note  Patient: Gregory [name]  Procedure(s) Performed: Procedure(s): NEPHROLITHOTOMY RIGHT PERCUTANEOUS WITH PHYSICIAN'S ACCESS,STENT (Right) CYSTOSCOPY WITH RETROGRADE PYELOGRAM (Right)  Patient Location: PACU  Anesthesia Type:General  Level of Consciousness: sedated, patient cooperative and responds to stimulation  Airway & Oxygen Therapy: Patient Spontanous Breathing and Patient connected to face mask oxygen  Post-op Assessment: Report given to RN and Post -op Vital signs reviewed and stable  Post vital signs: Reviewed and stable  Last Vitals:  Vitals:   05/01/16 1012  BP: (!) 141/84  Pulse: 60  Resp: 16  Temp: 36.7 C    Last Pain:  Vitals:   05/01/16 1031  TempSrc:   PainSc: 6       Patients Stated Pain Goal: 3 ([id number])  Complications: No apparent anesthesia complications

## 2016-05-01 NOTE — Progress Notes (Addendum)
Pharmacy Antibiotic Note  Gregory [name] is a 56 y.o. male admitted on 05/01/2016 with multiple large right renal calculi who failed ESWL. Patient underwent R PCNL today. Dose of Gentamicin given pre-op. Pharmacy has been consulted for Gentamicin dosing post-op for surgical prophylaxis.  Plan: Gentamicin 5 mg/kg (= 480mg  using adjusted body weight due to obesity) IV x 1 at 1330 on 05/02/16.  Pharmacy will sign off at this time. Please re-consult as needed.   Height: 6\' 1"  (185.4 cm) Weight: 264 lb (119.7 kg) IBW/kg (Calculated) : 79.9  Temp (24hrs), Avg:97.9 F (36.6 C), Min:97.7 F (36.5 C), Max:98.1 F (36.7 C)   Recent Labs Lab 04/26/16 1535 05/01/16 1627  WBC 6.7 12.0*  CREATININE 1.05 1.27*    Estimated Creatinine Clearance: 91.2 mL/min (by C-G formula based on SCr of 1.27 mg/dL (H)).    Allergies  Allergen Reactions  . Penicillins Itching, Swelling and Palpitations    Has patient had a PCN reaction causing immediate rash, facial/tongue/throat swelling, SOB or lightheadedness with hypotension:unsure Has patient had a PCN reaction causing severe rash involving mucus membranes or skin necrosis:unsure Has patient had a PCN reaction that required hospitalization:No Has patient had a PCN reaction occurring within the last 10 years:No If all of the above answers are "NO", then may proceed with Cephalosporin use. Childhood reaction.     Antimicrobials this admission: 3/5 Gentamicin peri-operatively >> 3/6   Thank you for allowing pharmacy to be a part of this patient's care.   Lindell Spar, PharmD, BCPS Pager: [phone] 05/01/2016 6:15 PM

## 2016-05-01 NOTE — H&P (Signed)
Urology Admission H&P  Chief Complaint: right flank pain  History of Present Illness: [name] is a 56yo with multiple large right renal calculi who has failed ESWL.  Past Medical History:  Diagnosis Date  . Arthritis   . Asthma   . Bradycardia   . Complication of anesthesia    bradycardia during a colonoscopy   . Frequency of urination   . GERD (gastroesophageal reflux disease)   . Sleep apnea 2015   osa, cpap   Past Surgical History:  Procedure Laterality Date  . LITHOTRIPSY     x2  . urinary stent   01/27/2016  . VASECTOMY      Home Medications:  Facility-Administered Medications Prior to Admission  Medication Dose Route Frequency Provider Last Rate Last Dose  . ceFAZolin (ANCEF) 1 g in dextrose 5 % 100 mL IVPB  1 g Intravenous Q8H Cleon Gustin, MD       Prescriptions Prior to Admission  Medication Sig Dispense Refill Last Dose  . acetaminophen (TYLENOL) 500 MG tablet Take 1,000 mg by mouth every 6 (six) hours as needed (for pain.).   Past Week at Unknown time  . cyclobenzaprine (FLEXERIL) 10 MG tablet Take 10 mg by mouth every 8 (eight) hours as needed for muscle spasms or muscle pain.  5 04/30/2016 at Unknown time  . Dextromethorphan-Guaifenesin (MUCINEX DM MAXIMUM STRENGTH) 60-1200 MG TB12 Take 1-2 tablets by mouth 2 (two) times daily as needed (for cough/congestion).   04/30/2016 at Unknown time  . fluticasone (FLONASE) 50 MCG/ACT nasal spray Place 2 sprays into the nose daily.    05/01/2016 at Unknown time  . gabapentin (NEURONTIN) 300 MG capsule Take 300 mg by mouth at bedtime.  3 04/30/2016 at Unknown time  . Glucos-Chondroit-Hyaluron-D3 (TRIGOSAMINE MAX ST PO) Take 2 tablets by mouth at bedtime.   04/30/2016 at Unknown time  . HYDROcodone-acetaminophen (NORCO) 7.5-325 MG tablet Take 1 tablet by mouth every 8 (eight) hours as needed for pain.   05/01/2016 at 0730  . ibuprofen (ADVIL,MOTRIN) 200 MG tablet Take 400 mg by mouth every 8 (eight) hours as needed (for pain).    Past Week at Unknown time  . mirabegron ER (MYRBETRIQ) 25 MG TB24 tablet Take 25 mg by mouth daily.   Past Week at Unknown time  . montelukast (SINGULAIR) 10 MG tablet Take 10 mg by mouth at bedtime.    04/30/2016 at Unknown time  . omeprazole (PRILOSEC) 20 MG capsule Take 20 mg by mouth 2 (two) times daily.  3 05/01/2016 at 0730  . phenylephrine (SUDAFED PE) 10 MG TABS tablet Take 10 mg by mouth every 4 (four) hours as needed (for sinuses).   Past Week at Unknown time  . Tamsulosin HCl (FLOMAX) 0.4 MG CAPS Take 0.4 mg by mouth 2 (two) times daily.    05/01/2016 at Unknown time  . aspirin 81 MG chewable tablet Chew 81 mg by mouth daily.     More than a month at Unknown time  . Multiple Vitamins-Minerals (ADULT GUMMY) CHEW Chew 2 tablets by mouth at bedtime.   More than a month at Unknown time  . Multiple Vitamins-Minerals (OCUVITE ADULT 50+ PO) Take 1 tablet by mouth at bedtime.   More than a month at Unknown time  . promethazine (PHENERGAN) 12.5 MG tablet Take 12.5 mg by mouth 3 (three) times daily as needed for nausea/vomiting.   More than a month at Unknown time   Allergies:  Allergies  Allergen Reactions  .  Penicillins Itching, Swelling and Palpitations    Has patient had a PCN reaction causing immediate rash, facial/tongue/throat swelling, SOB or lightheadedness with hypotension:unsure Has patient had a PCN reaction causing severe rash involving mucus membranes or skin necrosis:unsure Has patient had a PCN reaction that required hospitalization:No Has patient had a PCN reaction occurring within the last 10 years:No If all of the above answers are "NO", then may proceed with Cephalosporin use. Childhood reaction.     History reviewed. No pertinent family history. Social History:  reports that he has never smoked. He has never used smokeless tobacco. He reports that he does not drink alcohol or use drugs.  Review of Systems  All other systems reviewed and are negative.   Physical Exam:   Vital signs in last 24 hours: Temp:  [98.1 F (36.7 C)] 98.1 F (36.7 C) (03/05 1012) Pulse Rate:  [60] 60 (03/05 1012) Resp:  [16] 16 (03/05 1012) BP: (141)/(84) 141/84 (03/05 1012) SpO2:  [97 %] 97 % (03/05 1012) Weight:  [119.7 kg (264 lb)] 119.7 kg (264 lb) (03/05 1031) Physical Exam  Constitutional: He is oriented to person, place, and time. He appears well-developed and well-nourished.  HENT:  Head: Normocephalic and atraumatic.  Eyes: EOM are normal. Pupils are equal, round, and reactive to light.  Neck: Normal range of motion. No thyromegaly present.  Cardiovascular: Normal rate and regular rhythm.   Respiratory: No respiratory distress.  GI: Soft. He exhibits no distension.  Musculoskeletal: Normal range of motion. He exhibits no edema.  Neurological: He is alert and oriented to person, place, and time.  Skin: Skin is warm and dry.  Psychiatric: He has a normal mood and affect. His behavior is normal. Judgment and thought content normal.    Laboratory Data:  No results found for this or any previous visit (from the past 24 hour(s)). No results found for this or any previous visit (from the past 240 hour(s)). Creatinine:  Recent Labs  04/26/16 1535  CREATININE 1.05   Baseline Creatinine: 1  Impression/Assessment:  55yo with multiple right renal calculi  Plan:  The risks/benefits/alternatives to R PCNL was explained to the patient and he understands and wishes to proceed with surgery  Nicolette Bang 05/01/2016, 12:36 PM

## 2016-05-01 NOTE — Anesthesia Preprocedure Evaluation (Addendum)
Anesthesia Evaluation  Patient identified by MRN, date of birth, ID band Patient awake    Reviewed: Allergy & Precautions, NPO status , Patient's Chart, lab work & pertinent test results  Airway Mallampati: II  TM Distance: >3 FB Neck ROM: Full    Dental no notable dental hx.    Pulmonary sleep apnea and Continuous Positive Airway Pressure Ventilation ,    Pulmonary exam normal breath sounds clear to auscultation       Cardiovascular negative cardio ROS Normal cardiovascular exam Rhythm:Regular Rate:Normal     Neuro/Psych negative neurological ROS  negative psych ROS   GI/Hepatic Neg liver ROS, GERD  ,  Endo/Other  negative endocrine ROS  Renal/GU negative Renal ROS  negative genitourinary   Musculoskeletal negative musculoskeletal ROS (+)   Abdominal   Peds negative pediatric ROS (+)  Hematology negative hematology ROS (+)   Anesthesia Other Findings   Reproductive/Obstetrics negative OB ROS                             Anesthesia Physical Anesthesia Plan  ASA: II  Anesthesia Plan: General   Post-op Pain Management:    Induction: Intravenous  Airway Management Planned: Oral ETT  Additional Equipment:   Intra-op Plan:   Post-operative Plan: Extubation in OR  Informed Consent: I have reviewed the patients History and Physical, chart, labs and discussed the procedure including the risks, benefits and alternatives for the proposed anesthesia with the patient or authorized representative who has indicated his/her understanding and acceptance.   Dental advisory given  Plan Discussed with: CRNA and Surgeon  Anesthesia Plan Comments:         Anesthesia Quick Evaluation

## 2016-05-02 DIAGNOSIS — K219 Gastro-esophageal reflux disease without esophagitis: Secondary | ICD-10-CM | POA: Diagnosis not present

## 2016-05-02 DIAGNOSIS — G4733 Obstructive sleep apnea (adult) (pediatric): Secondary | ICD-10-CM | POA: Diagnosis not present

## 2016-05-02 DIAGNOSIS — N200 Calculus of kidney: Secondary | ICD-10-CM | POA: Diagnosis not present

## 2016-05-02 DIAGNOSIS — Z7951 Long term (current) use of inhaled steroids: Secondary | ICD-10-CM | POA: Diagnosis not present

## 2016-05-02 DIAGNOSIS — Z7982 Long term (current) use of aspirin: Secondary | ICD-10-CM | POA: Diagnosis not present

## 2016-05-02 LAB — BASIC METABOLIC PANEL
Anion gap: 8 (ref 5–15)
BUN: 12 mg/dL (ref 6–20)
CALCIUM: 8.6 mg/dL — AB (ref 8.9–10.3)
CO2: 24 mmol/L (ref 22–32)
CREATININE: 1.22 mg/dL (ref 0.61–1.24)
Chloride: 108 mmol/L (ref 101–111)
GFR calc Af Amer: >60 mL/min (ref >60)
Glucose, Bld: 163 mg/dL — ABNORMAL HIGH (ref 65–99)
Potassium: 4 mmol/L (ref 3.5–5.1)
SODIUM: 140 mmol/L (ref 135–145)

## 2016-05-02 LAB — GLUCOSE, CAPILLARY
GLUCOSE-CAPILLARY: 143 mg/dL — AB (ref 65–99)
Glucose-Capillary: 130 mg/dL — ABNORMAL HIGH (ref 65–99)

## 2016-05-02 LAB — CBC
HCT: 35.5 % — ABNORMAL LOW (ref 39.0–52.0)
Hemoglobin: 12.1 g/dL — ABNORMAL LOW (ref 13.0–17.0)
MCH: 30.9 pg (ref 26.0–34.0)
MCHC: 34.1 g/dL (ref 30.0–36.0)
MCV: 90.8 fL (ref 78.0–100.0)
PLATELETS: 179 10*3/uL (ref 150–400)
RBC: 3.91 MIL/uL — ABNORMAL LOW (ref 4.22–5.81)
RDW: 13.5 % (ref 11.5–15.5)
WBC: 12.2 10*3/uL — ABNORMAL HIGH (ref 4.0–10.5)

## 2016-05-02 MED ORDER — MENTHOL 3 MG MT LOZG
1.0000 | LOZENGE | OROMUCOSAL | Status: DC | PRN
  Filled 2016-05-02: qty 9

## 2016-05-02 MED ORDER — OXYCODONE HCL 5 MG PO TABS
5.0000 mg | ORAL_TABLET | ORAL | 0 refills | Status: DC | PRN
Start: 2016-05-02 — End: 2016-06-05

## 2016-05-02 MED FILL — oxyCODONE HCL 5 MG TABS: 5 | 5 days supply | Qty: 30 | Fill #0

## 2016-05-02 NOTE — Discharge Instructions (Signed)
Do not change dressing around the nephrostomy tube, Dr Alyson Ingles will remove it at your follow up visit on 3/8.  You may shower but try as best you can to keep the dressing out of the direct stream of water.  You do not need to flush the neprostomy tube at all. Be sure to keep the tube and drainage bag below the level of your kidneys.

## 2016-05-02 NOTE — Progress Notes (Signed)
D/C instructions reviewed w/ pt and wife. Both verbalize understanding and all questions answered. Pt d/c in w/c in stable condition by NT to wife's car. Pt in possession of d/c instructions, script, and all personal belongings.

## 2016-05-04 DIAGNOSIS — N200 Calculus of kidney: Secondary | ICD-10-CM | POA: Diagnosis not present

## 2016-05-04 DIAGNOSIS — N201 Calculus of ureter: Secondary | ICD-10-CM | POA: Diagnosis not present

## 2016-05-09 ENCOUNTER — Other Ambulatory Visit: Payer: Self-pay | Admitting: Urology

## 2016-05-16 MED FILL — TAMSULOSIN HCL 0.4 MG CAP: 0.4 | 90 days supply | Qty: 90 | Fill #0

## 2016-05-18 MED FILL — OMEPRAZOLE DR 20 MG CAPSULE: 20 | 90 days supply | Qty: 180 | Fill #1

## 2016-05-18 MED FILL — GABAPENTIN 300 MG CAPSULE: 300 | 90 days supply | Qty: 180 | Fill #1

## 2016-05-18 MED FILL — FLUTICASONE PROP 50 MCG SPR: 50 | 90 days supply | Qty: 48 | Fill #2

## 2016-05-18 MED FILL — MONTELUKAST SOD 10 MG TAB: 10 | 90 days supply | Qty: 90 | Fill #0

## 2016-05-22 MED FILL — OXYCODONE W/APAP 5/325 TAB: 5-325 | 5 days supply | Qty: 30 | Fill #0

## 2016-05-22 NOTE — Discharge Summary (Signed)
Physician Discharge Summary  Patient ID: Gregory [name] MRN: [id number] DOB/AGE: 1960-06-03 56 y.o.  Admit date: 05/01/2016 Discharge date: 05/02/2016  Admission Diagnoses: Right renal calculus  Discharge Diagnoses:  Active Problems:   Renal calculus, right   Discharged Condition: good  Hospital Course: The patient tolerated the procedure well and was transferred to the floor on IV pain meds, IV fluid. On POD#1 foley was removed, pt was started on regular diet and they ambulated in the halls. Prior to discharge the pt was tolerating a regular diet, pain was controlled on PO pain meds, they were ambulating without difficulty, and they had normal bowel function.   Consults: None  Significant Diagnostic Studies: none  Treatments: surgery: PCNL  Discharge Exam: Blood pressure 115/66, pulse (!) 56, temperature 98 F (36.7 C), temperature source Oral, resp. rate 18, height 6\' 1"  (1.854 m), weight 119.7 kg (264 lb), SpO2 96 %. General appearance: alert, cooperative and appears stated age Nose: Nares normal. Septum midline. Mucosa normal. No drainage or sinus tenderness. Neck: no adenopathy, no carotid bruit, no JVD, supple, symmetrical, trachea midline and thyroid not enlarged, symmetric, no tenderness/mass/nodules Resp: clear to auscultation bilaterally Cardio: regular rate and rhythm, S1, S2 normal, no murmur, click, rub or gallop GI: soft, non-tender; bowel sounds normal; no masses,  no organomegaly Extremities: extremities normal, atraumatic, no cyanosis or edema Neurologic: Grossly normal  Disposition: 01-Home or Self Care  Discharge Instructions    Discharge patient    Complete by:  As directed    Discharge disposition:  01-Home or Self Care   Discharge patient date:  05/02/2016     Allergies as of 05/02/2016      Reactions   Penicillins Itching, Swelling, Palpitations   Has patient had a PCN reaction causing immediate rash, facial/tongue/throat swelling, SOB or  lightheadedness with hypotension:unsure Has patient had a PCN reaction causing severe rash involving mucus membranes or skin necrosis:unsure Has patient had a PCN reaction that required hospitalization:No Has patient had a PCN reaction occurring within the last 10 years:No If all of the above answers are "NO", then may proceed with Cephalosporin use. Childhood reaction.      Medication List    STOP taking these medications   HYDROcodone-acetaminophen 7.5-325 MG tablet Commonly known as:  NORCO     TAKE these medications   acetaminophen 500 MG tablet Commonly known as:  TYLENOL Take 1,000 mg by mouth every 6 (six) hours as needed (for pain.).   ADULT GUMMY Chew Chew 2 tablets by mouth at bedtime.   OCUVITE ADULT 50+ PO Take 1 tablet by mouth at bedtime.   aspirin 81 MG chewable tablet Chew 81 mg by mouth daily.   cyclobenzaprine 10 MG tablet Commonly known as:  FLEXERIL Take 10 mg by mouth every 8 (eight) hours as needed for muscle spasms or muscle pain.   FLOMAX 0.4 MG Caps capsule Generic drug:  tamsulosin Take 0.4 mg by mouth 2 (two) times daily.   fluticasone 50 MCG/ACT nasal spray Commonly known as:  FLONASE Place 2 sprays into the nose daily.   gabapentin 300 MG capsule Commonly known as:  NEURONTIN Take 300 mg by mouth at bedtime.   ibuprofen 200 MG tablet Commonly known as:  ADVIL,MOTRIN Take 400 mg by mouth every 8 (eight) hours as needed (for pain).   MUCINEX DM MAXIMUM STRENGTH 60-1200 MG Tb12 Take 1-2 tablets by mouth 2 (two) times daily as needed (for cough/congestion).   MYRBETRIQ 25 MG Tb24 tablet Generic drug:  mirabegron ER Take 25 mg by mouth daily.   omeprazole 20 MG capsule Commonly known as:  PRILOSEC Take 20 mg by mouth 2 (two) times daily.   oxyCODONE 5 MG immediate release tablet Commonly known as:  Oxy IR/ROXICODONE Take 1 tablet (5 mg total) by mouth every 4 (four) hours as needed for moderate pain.   phenylephrine 10 MG Tabs  tablet Commonly known as:  SUDAFED PE Take 10 mg by mouth every 4 (four) hours as needed (for sinuses).   promethazine 12.5 MG tablet Commonly known as:  PHENERGAN Take 12.5 mg by mouth 3 (three) times daily as needed for nausea/vomiting.   SINGULAIR 10 MG tablet Generic drug:  montelukast Take 10 mg by mouth at bedtime.   TRIGOSAMINE MAX ST PO Take 2 tablets by mouth at bedtime.        SignedNicolette Bang 05/22/2016, 2:00 PM

## 2016-05-25 DIAGNOSIS — N200 Calculus of kidney: Secondary | ICD-10-CM | POA: Diagnosis not present

## 2016-05-29 ENCOUNTER — Encounter (HOSPITAL_COMMUNITY): Payer: Self-pay

## 2016-05-29 NOTE — Patient Instructions (Addendum)
Gregory [name]  05/29/2016   Your procedure is scheduled on: 06-05-16  Report to [facility] Main  Entrance take [facility]  elevators to 3rd floor to  [place] at 1:30PM.  Call this number if you have problems the morning of surgery [phone]   Remember: ONLY 1 PERSON MAY GO WITH YOU TO SHORT STAY TO GET  READY MORNING OF [place].  Do not eat food After Midnight. YOU MAY HAVE CLEAR LIQUIDS FROM MIDNIGHT UNTIL 9:30AM DAY OF SURGERY. NOTHING BY MOUTH AFTER 9:30AM!!     Take these medicines the morning of surgery with A SIP OF WATER: omeprazole(prilosec), tamsulosin(flomax), hydrocodone as needed, nasal spray, tylenol as needed, mucinex as needed                                 You may not have any metal on your body including hair pins and              piercings  Do not wear jewelry, lotions, powders or perfumes, deodorant             Men may shave face and neck.   Do not bring valuables to the hospital. [name].  Contacts, dentures or bridgework may not be worn into surgery.      Patients discharged the day of surgery will not be allowed to drive home.  Name and phone number of your driver:  Special Instructions: N/A              Please read over the following fact sheets you were given: _____________________________________________________________________                CLEAR LIQUID DIET   Foods Allowed                                                                     Foods Excluded  Coffee and tea, regular and decaf                             liquids that you cannot  Plain Jell-O in any flavor                                             see through such as: Fruit ices (not with fruit pulp)                                     milk, soups, orange juice  Iced Popsicles                                    All solid food Carbonated beverages, regular and diet  Cranberry, grape and apple juices Sports drinks like Gatorade Lightly seasoned clear broth or consume(fat free) Sugar, honey syrup  Sample Menu Breakfast                                Lunch                                     Supper Cranberry juice                    Beef broth                            Chicken broth Jell-O                                     Grape juice                           Apple juice Coffee or tea                        Jell-O                                      Popsicle                                                Coffee or tea                        Coffee or tea  _____________________________________________________________________  [facility] - Preparing for Surgery Before surgery, you can play an important role.  Because skin is not sterile, your skin needs to be as free of germs as possible.  You can reduce the number of germs on your skin by washing with CHG (chlorahexidine gluconate) soap before surgery.  CHG is an antiseptic cleaner which kills germs and bonds with the skin to continue killing germs even after washing. Please DO NOT use if you have an allergy to CHG or antibacterial soaps.  If your skin becomes reddened/irritated stop using the CHG and inform your nurse when you arrive at Short Stay. Do not shave (including legs and underarms) for at least 48 hours prior to the first CHG shower.  You may shave your face/neck. Please follow these instructions carefully:  1.  Shower with CHG Soap the night before surgery and the  morning of Surgery.  2.  If you choose to wash your hair, wash your hair first as usual with your  normal  shampoo.  3.  After you shampoo, rinse your hair and body thoroughly to remove the  shampoo.                           4.  Use CHG as you would any other liquid soap.  You can apply chg directly  to the skin and wash  Gently with a scrungie or clean washcloth.  5.  Apply the CHG Soap to your  body ONLY FROM THE NECK DOWN.   Do not use on face/ open                           Wound or open sores. Avoid contact with eyes, ears mouth and genitals (private parts).                       Wash face,  Genitals (private parts) with your normal soap.             6.  Wash thoroughly, paying special attention to the area where your surgery  will be performed.  7.  Thoroughly rinse your body with warm water from the neck down.  8.  DO NOT shower/wash with your normal soap after using and rinsing off  the CHG Soap.                9.  Pat yourself dry with a clean towel.            10.  Wear clean pajamas.            11.  Place clean sheets on your bed the night of your first shower and do not  sleep with pets. Day of Surgery : Do not apply any lotions/deodorants the morning of surgery.  Please wear clean clothes to the hospital/surgery center.  FAILURE TO FOLLOW THESE INSTRUCTIONS MAY RESULT IN THE CANCELLATION OF YOUR SURGERY PATIENT SIGNATURE_________________________________  NURSE SIGNATURE__________________________________  ________________________________________________________________________

## 2016-05-29 NOTE — Progress Notes (Signed)
CXR 05-01-16 epic

## 2016-05-30 ENCOUNTER — Encounter (HOSPITAL_COMMUNITY): Payer: Self-pay

## 2016-05-30 ENCOUNTER — Encounter (HOSPITAL_COMMUNITY)
Admission: RE | Admit: 2016-05-30 | Discharge: 2016-05-30 | Disposition: A | Discharge status: Home / Self Care | Payer: 59 | Source: Ambulatory Visit | Attending: Urology | Admitting: Urology

## 2016-05-30 DIAGNOSIS — N201 Calculus of ureter: Secondary | ICD-10-CM | POA: Diagnosis not present

## 2016-05-30 DIAGNOSIS — Z01812 Encounter for preprocedural laboratory examination: Secondary | ICD-10-CM | POA: Diagnosis not present

## 2016-05-30 LAB — BASIC METABOLIC PANEL
ANION GAP: 7 (ref 5–15)
BUN: 10 mg/dL (ref 6–20)
CHLORIDE: 106 mmol/L (ref 101–111)
CO2: 26 mmol/L (ref 22–32)
Calcium: 9.1 mg/dL (ref 8.9–10.3)
Creatinine, Ser: 1.05 mg/dL (ref 0.61–1.24)
Glucose, Bld: 72 mg/dL (ref 65–99)
POTASSIUM: 3.8 mmol/L (ref 3.5–5.1)
SODIUM: 139 mmol/L (ref 135–145)

## 2016-05-30 LAB — CBC
HEMATOCRIT: 37.9 % — AB (ref 39.0–52.0)
Hemoglobin: 12.8 g/dL — ABNORMAL LOW (ref 13.0–17.0)
MCH: 29.8 pg (ref 26.0–34.0)
MCHC: 33.8 g/dL (ref 30.0–36.0)
MCV: 88.1 fL (ref 78.0–100.0)
Platelets: 191 10*3/uL (ref 150–400)
RBC: 4.3 MIL/uL (ref 4.22–5.81)
RDW: 13.1 % (ref 11.5–15.5)
WBC: 5.6 10*3/uL (ref 4.0–10.5)

## 2016-06-05 ENCOUNTER — Encounter (HOSPITAL_COMMUNITY): Payer: Self-pay | Admitting: *Deleted

## 2016-06-05 ENCOUNTER — Ambulatory Visit (HOSPITAL_COMMUNITY): Payer: 59 | Admitting: Anesthesiology

## 2016-06-05 ENCOUNTER — Ambulatory Visit (HOSPITAL_COMMUNITY): Payer: 59

## 2016-06-05 ENCOUNTER — Ambulatory Visit (HOSPITAL_COMMUNITY)
Admission: RE | Admit: 2016-06-05 | Discharge: 2016-06-05 | Disposition: A | Discharge status: Home / Self Care | Payer: 59 | Source: Ambulatory Visit | Attending: Urology | Admitting: Urology

## 2016-06-05 ENCOUNTER — Encounter (HOSPITAL_COMMUNITY)
Admission: RE | Disposition: A | Discharge status: Home / Self Care | Payer: Self-pay | Source: Ambulatory Visit | Attending: Urology

## 2016-06-05 DIAGNOSIS — G4733 Obstructive sleep apnea (adult) (pediatric): Secondary | ICD-10-CM | POA: Insufficient documentation

## 2016-06-05 DIAGNOSIS — Z880 Allergy status to penicillin: Secondary | ICD-10-CM | POA: Insufficient documentation

## 2016-06-05 DIAGNOSIS — Z79899 Other long term (current) drug therapy: Secondary | ICD-10-CM | POA: Insufficient documentation

## 2016-06-05 DIAGNOSIS — Z7951 Long term (current) use of inhaled steroids: Secondary | ICD-10-CM | POA: Insufficient documentation

## 2016-06-05 DIAGNOSIS — N200 Calculus of kidney: Secondary | ICD-10-CM

## 2016-06-05 DIAGNOSIS — Z0389 Encounter for observation for other suspected diseases and conditions ruled out: Secondary | ICD-10-CM | POA: Diagnosis not present

## 2016-06-05 DIAGNOSIS — J45909 Unspecified asthma, uncomplicated: Secondary | ICD-10-CM | POA: Diagnosis not present

## 2016-06-05 DIAGNOSIS — K219 Gastro-esophageal reflux disease without esophagitis: Secondary | ICD-10-CM | POA: Diagnosis not present

## 2016-06-05 DIAGNOSIS — Z91048 Other nonmedicinal substance allergy status: Secondary | ICD-10-CM | POA: Diagnosis not present

## 2016-06-05 DIAGNOSIS — Z419 Encounter for procedure for purposes other than remedying health state, unspecified: Secondary | ICD-10-CM

## 2016-06-05 DIAGNOSIS — N202 Calculus of kidney with calculus of ureter: Secondary | ICD-10-CM | POA: Diagnosis not present

## 2016-06-05 DIAGNOSIS — S66110A Strain of flexor muscle, fascia and tendon of right index finger at wrist and hand level, initial encounter: Secondary | ICD-10-CM | POA: Diagnosis not present

## 2016-06-05 DIAGNOSIS — G4730 Sleep apnea, unspecified: Secondary | ICD-10-CM | POA: Diagnosis not present

## 2016-06-05 DIAGNOSIS — N201 Calculus of ureter: Secondary | ICD-10-CM | POA: Diagnosis not present

## 2016-06-05 HISTORY — PX: CYSTOSCOPY/URETEROSCOPY/HOLMIUM LASER/STENT PLACEMENT: SHX6546

## 2016-06-05 SURGERY — CYSTOSCOPY/URETEROSCOPY/HOLMIUM LASER/STENT PLACEMENT
Anesthesia: General | Laterality: Right

## 2016-06-05 MED ORDER — MIDAZOLAM HCL 5 MG/5ML IJ SOLN
INTRAMUSCULAR | Status: DC | PRN
  Administered 2016-06-05 (×2): 1 mg via INTRAVENOUS

## 2016-06-05 MED ORDER — OXYCODONE-ACETAMINOPHEN 5-325 MG PO TABS: 1.0000 | ORAL_TABLET | ORAL | 0 refills | Status: DC | PRN

## 2016-06-05 MED ORDER — FENTANYL CITRATE (PF) 100 MCG/2ML IJ SOLN
INTRAMUSCULAR | Status: AC
  Filled 2016-06-05: qty 2

## 2016-06-05 MED ORDER — OXYCODONE HCL 5 MG/5ML PO SOLN
5.0000 mg | Freq: Once | ORAL | Status: AC | PRN
  Filled 2016-06-05: qty 5

## 2016-06-05 MED ORDER — PROPOFOL 10 MG/ML IV BOLUS
INTRAVENOUS | Status: AC
  Filled 2016-06-05: qty 20

## 2016-06-05 MED ORDER — LACTATED RINGERS IV SOLN
INTRAVENOUS | Status: DC
  Administered 2016-06-05 (×2): via INTRAVENOUS

## 2016-06-05 MED ORDER — PROMETHAZINE HCL 25 MG/ML IJ SOLN: 6.2500 mg | INTRAMUSCULAR | Status: DC | PRN

## 2016-06-05 MED ORDER — OXYCODONE HCL 5 MG PO TABS
5.0000 mg | ORAL_TABLET | Freq: Once | ORAL | Status: AC | PRN
  Administered 2016-06-05: 5 mg via ORAL
  Filled 2016-06-05: qty 1

## 2016-06-05 MED ORDER — TAMSULOSIN HCL 0.4 MG PO CAPS: 0.4000 mg | ORAL_CAPSULE | Freq: Two times a day (BID) | ORAL | 0 refills | Status: DC

## 2016-06-05 MED ORDER — CEFTRIAXONE SODIUM 2 G IJ SOLR
2.0000 g | INTRAMUSCULAR | Status: AC
  Administered 2016-06-05: 2 g via INTRAVENOUS
  Filled 2016-06-05: qty 2

## 2016-06-05 MED ORDER — KETOROLAC TROMETHAMINE 30 MG/ML IJ SOLN: 30.0000 mg | Freq: Once | INTRAMUSCULAR | Status: DC | PRN

## 2016-06-05 MED ORDER — IOPAMIDOL (ISOVUE-300) INJECTION 61%
INTRAVENOUS | Status: DC | PRN
  Administered 2016-06-05: 10 mL via URETHRAL

## 2016-06-05 MED ORDER — ONDANSETRON HCL 4 MG/2ML IJ SOLN
INTRAMUSCULAR | Status: DC | PRN
  Administered 2016-06-05: 4 mg via INTRAVENOUS

## 2016-06-05 MED ORDER — SODIUM CHLORIDE 0.9 % IR SOLN
Status: DC | PRN
  Administered 2016-06-05: 3000 mL

## 2016-06-05 MED ORDER — EPHEDRINE SULFATE 50 MG/ML IJ SOLN
INTRAMUSCULAR | Status: DC | PRN
  Administered 2016-06-05: 5 mg via INTRAVENOUS

## 2016-06-05 MED ORDER — LIDOCAINE HCL (CARDIAC) 20 MG/ML IV SOLN
INTRAVENOUS | Status: DC | PRN
  Administered 2016-06-05: 80 mg via INTRAVENOUS

## 2016-06-05 MED ORDER — PROPOFOL 10 MG/ML IV BOLUS
INTRAVENOUS | Status: DC | PRN
  Administered 2016-06-05: 25 mg via INTRAVENOUS
  Administered 2016-06-05: 175 mg via INTRAVENOUS

## 2016-06-05 MED ORDER — LIDOCAINE 2% (20 MG/ML) 5 ML SYRINGE
INTRAMUSCULAR | Status: AC
  Filled 2016-06-05: qty 5

## 2016-06-05 MED ORDER — DEXAMETHASONE SODIUM PHOSPHATE 10 MG/ML IJ SOLN
INTRAMUSCULAR | Status: DC | PRN
  Administered 2016-06-05: 8 mg via INTRAVENOUS

## 2016-06-05 MED ORDER — FENTANYL CITRATE (PF) 100 MCG/2ML IJ SOLN
INTRAMUSCULAR | Status: DC | PRN
  Administered 2016-06-05: 50 ug via INTRAVENOUS
  Administered 2016-06-05: 100 ug via INTRAVENOUS

## 2016-06-05 MED ORDER — ONDANSETRON HCL 4 MG/2ML IJ SOLN
INTRAMUSCULAR | Status: AC
  Filled 2016-06-05: qty 2

## 2016-06-05 MED ORDER — DEXAMETHASONE SODIUM PHOSPHATE 10 MG/ML IJ SOLN
INTRAMUSCULAR | Status: AC
  Filled 2016-06-05: qty 1

## 2016-06-05 MED ORDER — METOCLOPRAMIDE HCL 5 MG/ML IJ SOLN
INTRAMUSCULAR | Status: AC
  Filled 2016-06-05: qty 2

## 2016-06-05 MED ORDER — FENTANYL CITRATE (PF) 100 MCG/2ML IJ SOLN
25.0000 ug | INTRAMUSCULAR | Status: DC | PRN
  Administered 2016-06-05 (×2): 25 ug via INTRAVENOUS

## 2016-06-05 MED ORDER — SODIUM CHLORIDE 0.9 % IR SOLN
Status: DC | PRN
  Administered 2016-06-05: 1000 mL

## 2016-06-05 MED ORDER — GLYCOPYRROLATE 0.2 MG/ML IJ SOLN
INTRAMUSCULAR | Status: DC | PRN
  Administered 2016-06-05 (×2): 0.2 mg via INTRAVENOUS

## 2016-06-05 MED ORDER — MIDAZOLAM HCL 2 MG/2ML IJ SOLN
INTRAMUSCULAR | Status: AC
  Filled 2016-06-05: qty 2

## 2016-06-05 MED ORDER — SODIUM CHLORIDE 0.9 % IR SOLN
Status: DC | PRN
  Administered 2016-06-05 (×2): 1000 mL

## 2016-06-05 MED FILL — CYCLOBENZAPRINE 10 MG TAB: 10 | 30 days supply | Qty: 90 | Fill #2

## 2016-06-05 SURGICAL SUPPLY — 25 items
BAG URO CATCHER STRL LF (MISCELLANEOUS) ×2 IMPLANT
BASKET DAKOTA 1.9FR 11X120 (BASKET) IMPLANT
BASKET LASER NITINOL 1.9FR (BASKET) IMPLANT
CATH FOLEY LATEX FREE 20FR (CATHETERS)
CATH FOLEY LF 20FR (CATHETERS) IMPLANT
CATH INTERMIT  6FR 70CM (CATHETERS) ×2 IMPLANT
CLOTH BEACON ORANGE TIMEOUT ST (SAFETY) ×2 IMPLANT
COVER SURGICAL LIGHT HANDLE (MISCELLANEOUS) ×2 IMPLANT
EXTRACTOR STONE NITINOL NGAGE (UROLOGICAL SUPPLIES) ×2 IMPLANT
FIBER LASER TRAC TIP (UROLOGICAL SUPPLIES) ×2 IMPLANT
GLOVE BIO SURGEON STRL SZ8 (GLOVE) ×2 IMPLANT
GOWN STRL REUS W/TWL XL LVL3 (GOWN DISPOSABLE) ×4 IMPLANT
GUIDEWIRE ANG ZIPWIRE 038X150 (WIRE) ×2 IMPLANT
GUIDEWIRE STR DUAL SENSOR (WIRE) ×2 IMPLANT
IV NS 1000ML (IV SOLUTION) ×1
IV NS 1000ML BAXH (IV SOLUTION) ×1 IMPLANT
MANIFOLD NEPTUNE II (INSTRUMENTS) ×2 IMPLANT
PACK CYSTO (CUSTOM PROCEDURE TRAY) ×2 IMPLANT
SHEATH ACCESS URETERAL 38CM (SHEATH) ×2 IMPLANT
STENT CONTOUR 6FRX26X.038 (STENTS) IMPLANT
STENT URET 6FRX26 CONTOUR (STENTS) ×2 IMPLANT
SYR CONTROL 10ML LL (SYRINGE) IMPLANT
SYRINGE IRR TOOMEY STRL 70CC (SYRINGE) IMPLANT
TUBE FEEDING 8FR 16IN STR KANG (MISCELLANEOUS) ×2 IMPLANT
TUBING CONNECTING 10 (TUBING) ×2 IMPLANT

## 2016-06-05 NOTE — H&P (Addendum)
Urology Admission H&P  Chief Complaint: right flank pain  History of Present Illness: [name] is a 56yo with a hx of a large right renal calculus s/p PCNL who presents today for removal of the 2-18mm ureteral fragments  Past Medical History:  Diagnosis Date  . Arthritis   . Asthma   . Bradycardia   . Complication of anesthesia    bradycardia during a colonoscopy   . Frequency of urination   . GERD (gastroesophageal reflux disease)   . Sleep apnea 2015   osa, cpap   Past Surgical History:  Procedure Laterality Date  . CYSTOSCOPY W/ RETROGRADES Right 05/01/2016   Procedure: CYSTOSCOPY WITH RETROGRADE PYELOGRAM;  Surgeon: Cleon Gustin, MD;  Location: [facility] ORS;  Service: Urology;  Laterality: Right;  . LITHOTRIPSY     x2  . NEPHROLITHOTOMY Right 05/01/2016   Procedure: NEPHROLITHOTOMY RIGHT PERCUTANEOUS WITH PHYSICIAN'S ACCESS,STENT;  Surgeon: Cleon Gustin, MD;  Location: [facility] ORS;  Service: Urology;  Laterality: Right;  . urinary stent   01/27/2016  . VASECTOMY      Home Medications:  Facility-Administered Medications Prior to Admission  Medication Dose Route Frequency Provider Last Rate Last Dose  . ceFAZolin (ANCEF) 1 g in dextrose 5 % 100 mL IVPB  1 g Intravenous Q8H Cleon Gustin, MD       Prescriptions Prior to Admission  Medication Sig Dispense Refill Last Dose  . acetaminophen (TYLENOL) 500 MG tablet Take 1,000 mg by mouth every 6 (six) hours as needed for mild pain (for pain.).    Past Week at Unknown time  . cyclobenzaprine (FLEXERIL) 10 MG tablet Take 10 mg by mouth at bedtime as needed for muscle spasms.   5 06/02/2016  . Dextromethorphan-Guaifenesin (MUCINEX DM MAXIMUM STRENGTH) 60-1200 MG TB12 Take 1 tablet by mouth daily as needed (for cough/congestion).    06/02/2016  . fluticasone (FLONASE) 50 MCG/ACT nasal spray Place 1 spray into the nose daily.    06/05/2016 at 0700  . gabapentin (NEURONTIN) 300 MG capsule Take 300 mg by mouth at bedtime.  3 06/04/2016 at  pm  . Glucos-Chondroit-Hyaluron-D3 (TRIGOSAMINE MAX ST PO) Take 1 tablet by mouth at bedtime.    06/04/2016 at pm  . HYDROcodone-acetaminophen (NORCO) 7.5-325 MG tablet Take 1 tablet by mouth 2 (two) times daily.   Past Month at Unknown time  . montelukast (SINGULAIR) 10 MG tablet Take 10 mg by mouth at bedtime.    06/04/2016 at Unknown time  . omeprazole (PRILOSEC) 20 MG capsule Take 20 mg by mouth 2 (two) times daily.  3 06/05/2016 at 0700  . oxyCODONE-acetaminophen (PERCOCET/ROXICET) 5-325 MG tablet Take 1 tablet by mouth every 4 (four) hours as needed for severe pain.   06/05/2016 at 0700  . Tamsulosin HCl (FLOMAX) 0.4 MG CAPS Take 0.4 mg by mouth 2 (two) times daily.    06/05/2016 at 0700  . Multiple Vitamins-Minerals (OCUVITE ADULT 50+ PO) Take 1 tablet by mouth at bedtime.   More than a month at Unknown time  . oxyCODONE (OXY IR/ROXICODONE) 5 MG immediate release tablet Take 1 tablet (5 mg total) by mouth every 4 (four) hours as needed for moderate pain. 30 tablet 0 Not Taking at Unknown time  . promethazine (PHENERGAN) 12.5 MG tablet Take 12.5 mg by mouth 3 (three) times daily as needed for nausea or vomiting.    More than a month at Unknown time   Allergies:  Allergies  Allergen Reactions  . Tape Rash  hyperfixed tape caused redness and irritation   . Penicillins Itching, Swelling and Palpitations    Has patient had a PCN reaction causing immediate rash, facial/tongue/throat swelling, SOB or lightheadedness with hypotension:unsure Has patient had a PCN reaction causing severe rash involving mucus membranes or skin necrosis:unsure Has patient had a PCN reaction that required hospitalization:No Has patient had a PCN reaction occurring within the last 10 years:No If all of the above answers are "NO", then may proceed with Cephalosporin use. Childhood reaction.     History reviewed. No pertinent family history. Social History:  reports that he has never smoked. He has never used smokeless  tobacco. He reports that he does not drink alcohol or use drugs.  Review of Systems  Genitourinary: Positive for flank pain and hematuria.  All other systems reviewed and are negative.   Physical Exam:  Vital signs in last 24 hours: Temp:  [97.5 F (36.4 C)] 97.5 F (36.4 C) (04/09 1325) Pulse Rate:  [51] 51 (04/09 1325) Resp:  [18] 18 (04/09 1325) BP: (130)/(81) 130/81 (04/09 1325) SpO2:  [98 %] 98 % (04/09 1325) Weight:  [116.7 kg (257 lb 3 oz)] 116.7 kg (257 lb 3 oz) (04/09 1351) Physical Exam  Constitutional: He is oriented to person, place, and time. He appears well-developed and well-nourished.  HENT:  Head: Normocephalic and atraumatic.  Eyes: EOM are normal. Pupils are equal, round, and reactive to light.  Neck: Normal range of motion. No thyromegaly present.  Cardiovascular: Normal rate and regular rhythm.   Respiratory: Effort normal. No respiratory distress.  GI: Soft. He exhibits no distension.  Musculoskeletal: Normal range of motion. He exhibits no edema.  Neurological: He is alert and oriented to person, place, and time.  Skin: Skin is warm and dry.  Psychiatric: He has a normal mood and affect. His behavior is normal. Judgment and thought content normal.    Laboratory Data:  No results found for this or any previous visit (from the past 24 hour(s)). No results found for this or any previous visit (from the past 240 hour(s)). Creatinine:  Recent Labs  05/30/16 1407  CREATININE 1.05   Baseline Creatinine: 1  Impression/Assessment:  55yo with right ureteral calculi  Plan:  The risks/benefits/alternatives to right ureteroscopic stone extraction was explained to the patient and he understands and wishes to proceed with surgery  Nicolette Bang 06/05/2016, 2:56 PM

## 2016-06-05 NOTE — Discharge Instructions (Signed)
General Anesthesia, Adult, Care After These instructions provide you with information about caring for yourself after your procedure. Your health care provider may also give you more specific instructions. Your treatment has been planned according to current medical practices, but problems sometimes occur. Call your health care provider if you have any problems or questions after your procedure. What can I expect after the procedure? After the procedure, it is common to have:  Vomiting.  A sore throat.  Mental slowness. It is common to feel:  Nauseous.  Cold or shivery.  Sleepy.  Tired.  Sore or achy, even in parts of your body where you did not have surgery. Follow these instructions at home: For at least 24 hours after the procedure:   Do not:  Participate in activities where you could fall or become injured.  Drive.  Use heavy machinery.  Drink alcohol.  Take sleeping pills or medicines that cause drowsiness.  Make important decisions or sign legal documents.  Take care of children on your own.  Rest. Eating and drinking   If you vomit, drink water, juice, or soup when you can drink without vomiting.  Drink enough fluid to keep your urine clear or pale yellow.  Make sure you have little or no nausea before eating solid foods.  Follow the diet recommended by your health care provider. General instructions   Have a responsible adult stay with you until you are awake and alert.  Return to your normal activities as told by your health care provider. Ask your health care provider what activities are safe for you.  Take over-the-counter and prescription medicines only as told by your health care provider.  If you smoke, do not smoke without supervision.  Keep all follow-up visits as told by your health care provider. This is important. Contact a health care provider if:  You continue to have nausea or vomiting at home, and medicines are not helpful.  You  cannot drink fluids or start eating again.  You cannot urinate after 8-12 hours.  You develop a skin rash.  You have fever.  You have increasing redness at the site of your procedure. Get help right away if:  You have difficulty breathing.  You have chest pain.  You have unexpected bleeding.  You feel that you are having a life-threatening or urgent problem. This information is not intended to replace advice given to you by your health care provider. Make sure you discuss any questions you have with your health care provider. Document Released: 05/22/2000 Document Revised: 07/19/2015 Document Reviewed: 01/28/2015 Elsevier Interactive Patient Education  2017 [employer]       Ureteral Stent Implantation, Care After Refer to this sheet in the next few weeks. These instructions provide you with information about caring for yourself after your procedure. Your health care provider may also give you more specific instructions. Your treatment has been planned according to current medical practices, but problems sometimes occur. Call your health care provider if you have any problems or questions after your procedure. What can I expect after the procedure? After the procedure, it is common to have:  Nausea.  Mild pain when you urinate. You may feel this pain in your lower back or lower abdomen. Pain should stop within a few minutes after you urinate. This may last for up to 1 week.  A small amount of blood in your urine for several days. Follow these instructions at home:   Medicines   Take over-the-counter and prescription medicines  only as told by your health care provider.  If you were prescribed an antibiotic medicine, take it as told by your health care provider. Do not stop taking the antibiotic even if you start to feel better.  Do not drive for 24 hours if you received a sedative.  Do not drive or operate heavy machinery while taking prescription pain  medicines. Activity   Return to your normal activities as told by your health care provider. Ask your health care provider what activities are safe for you.  Do not lift anything that is heavier than 10 lb (4.5 kg). Follow this limit for 1 week after your procedure, or for as long as told by your health care provider. General instructions   Watch for any blood in your urine. Call your health care provider if the amount of blood in your urine increases.  If you have a catheter:  Follow instructions from your health care provider about taking care of your catheter and collection bag.  Do not take baths, swim, or use a hot tub until your health care provider approves.  Drink enough fluid to keep your urine clear or pale yellow.  Keep all follow-up visits as told by your health care provider. This is important. Contact a health care provider if:  You have pain that gets worse or does not get better with medicine, especially pain when you urinate.  You have difficulty urinating.  You feel nauseous or you vomit repeatedly during a period of more than 2 days after the procedure. Get help right away if:  Your urine is dark red or has blood clots in it.  You are leaking urine (have incontinence).  The end of the stent comes out of your urethra.  You cannot urinate.  You have sudden, sharp, or severe pain in your abdomen or lower back.  You have a fever. This information is not intended to replace advice given to you by your health care provider. Make sure you discuss any questions you have with your health care provider. Document Released: 10/16/2012 Document Revised: 07/22/2015 Document Reviewed: 08/28/2014 Elsevier Interactive Patient Education  2017 [employer].

## 2016-06-05 NOTE — Transfer of Care (Signed)
Immediate Anesthesia Transfer of Care Note  Patient: Gregory [name]  Procedure(s) Performed: Procedure(s): CYSTOSCOPY/RETROGRADE PYELOGRAM/URETEROSCOPY/HOLMIUM LASER/STENT EXCHANGE (Right)  Patient Location: PACU  Anesthesia Type:General  Level of Consciousness: awake, oriented, drowsy, patient cooperative, lethargic and responds to stimulation  Airway & Oxygen Therapy: Patient Spontanous Breathing and Patient connected to face mask oxygen  Post-op Assessment: Report given to RN, Post -op Vital signs reviewed and stable and Patient moving all extremities  Post vital signs: Reviewed and stable  Last Vitals:  Vitals:   06/05/16 1325 06/05/16 1648  BP: 130/81 (!) 143/99  Pulse: (!) 51   Resp: 18 13  Temp: 36.4 C (P) 36.3 C    Last Pain:  Vitals:   06/05/16 1344  TempSrc:   PainSc: 6       Patients Stated Pain Goal: 3 (77/82/42 [id number])  Complications: No apparent anesthesia complications

## 2016-06-05 NOTE — Brief Op Note (Signed)
06/05/2016  4:30 PM  PATIENT:  Gregory [name]  56 y.o. male  PRE-OPERATIVE DIAGNOSIS:  RIGHT URETERAL CALCULUS  POST-OPERATIVE DIAGNOSIS:  RIGHT URETERAL CALCULUS  PROCEDURE:  Procedure(s): CYSTOSCOPY/RETROGRADE PYELOGRAM/URETEROSCOPY/HOLMIUM LASER/STENT EXCHANGE (Right)  SURGEON:  Surgeon(s) and Role:    * Cleon Gustin, MD - Primary  PHYSICIAN ASSISTANT:   ASSISTANTS: none   ANESTHESIA:   general  EBL:  Total I/O In: 1000 [I.V.:1000] Out: -   BLOOD ADMINISTERED:none  DRAINS: right 6x26 JJ ureteral stetn without tether   LOCAL MEDICATIONS USED:  NONE  SPECIMEN:  Source of Specimen:  rigth ureteral calculus  DISPOSITION OF SPECIMEN:  N/A  COUNTS:  YES  TOURNIQUET:  * No tourniquets in log *  DICTATION: .Note written in EPIC  PLAN OF CARE: Discharge to home after PACU  PATIENT DISPOSITION:  PACU - hemodynamically stable.   Delay start of Pharmacological VTE agent (>24hrs) due to surgical blood loss or risk of bleeding: not applicable

## 2016-06-05 NOTE — Anesthesia Preprocedure Evaluation (Signed)
Anesthesia Evaluation  Patient identified by MRN, date of birth, ID band Patient awake    Reviewed: Allergy & Precautions, NPO status , Patient's Chart, lab work & pertinent test results  Airway Mallampati: II  TM Distance: >3 FB Neck ROM: Full    Dental no notable dental hx.    Pulmonary asthma , sleep apnea and Continuous Positive Airway Pressure Ventilation ,    Pulmonary exam normal breath sounds clear to auscultation       Cardiovascular negative cardio ROS Normal cardiovascular exam Rhythm:Regular Rate:Normal     Neuro/Psych negative neurological ROS  negative psych ROS   GI/Hepatic Neg liver ROS, GERD  Medicated,  Endo/Other  negative endocrine ROS  Renal/GU negative Renal ROS  negative genitourinary   Musculoskeletal negative musculoskeletal ROS (+)   Abdominal   Peds negative pediatric ROS (+)  Hematology negative hematology ROS (+)   Anesthesia Other Findings   Reproductive/Obstetrics negative OB ROS                             Anesthesia Physical Anesthesia Plan  ASA: II  Anesthesia Plan: General   Post-op Pain Management:    Induction: Intravenous  Airway Management Planned: LMA  Additional Equipment:   Intra-op Plan:   Post-operative Plan: Extubation in OR  Informed Consent: I have reviewed the patients History and Physical, chart, labs and discussed the procedure including the risks, benefits and alternatives for the proposed anesthesia with the patient or authorized representative who has indicated his/her understanding and acceptance.   Dental advisory given  Plan Discussed with: CRNA and Surgeon  Anesthesia Plan Comments:         Anesthesia Quick Evaluation

## 2016-06-05 NOTE — Anesthesia Procedure Notes (Signed)
Procedure Name: LMA Insertion Date/Time: 06/05/2016 3:25 PM Performed by: Myrtie Soman Pre-anesthesia Checklist: Patient identified, Emergency Drugs available, Suction available, Patient being monitored and Timeout performed Patient Re-evaluated:Patient Re-evaluated prior to inductionOxygen Delivery Method: Circle system utilized Preoxygenation: Pre-oxygenation with 100% oxygen Intubation Type: IV induction LMA: LMA with gastric port inserted LMA Size: 4.0 Number of attempts: 1 Placement Confirmation: positive ETCO2 Tube secured with: Tape Dental Injury: Teeth and Oropharynx as per pre-operative assessment

## 2016-06-05 NOTE — Anesthesia Postprocedure Evaluation (Addendum)
Anesthesia Post Note  Patient: HAKEEM FRAZZINI  Procedure(s) Performed: Procedure(s) (LRB): CYSTOSCOPY/RETROGRADE PYELOGRAM/URETEROSCOPY/HOLMIUM LASER/STENT EXCHANGE (Right)  Patient location during evaluation: PACU Anesthesia Type: General Level of consciousness: awake and alert Pain management: pain level controlled Vital Signs Assessment: post-procedure vital signs reviewed and stable Respiratory status: spontaneous breathing, nonlabored ventilation, respiratory function stable and patient connected to nasal cannula oxygen Cardiovascular status: blood pressure returned to baseline and stable Postop Assessment: no signs of nausea or vomiting Anesthetic complications: no       Last Vitals:  Vitals:   06/05/16 1730 06/05/16 1747  BP: (!) 142/90 (!) 146/89  Pulse: 73 70  Resp: 14 15  Temp: 36.3 C 36.6 C    Last Pain:  Vitals:   06/05/16 1806  TempSrc:   PainSc: 4                  Dayleen Beske S

## 2016-06-06 ENCOUNTER — Encounter (HOSPITAL_COMMUNITY): Payer: Self-pay | Admitting: Urology

## 2016-06-13 DIAGNOSIS — N200 Calculus of kidney: Secondary | ICD-10-CM | POA: Diagnosis not present

## 2016-06-14 NOTE — Op Note (Signed)
.  Preoperative diagnosis: rightl ureteral calculi  Postoperative diagnosis: Same  Procedure: 1 cystoscopy 2. Right retrograde pyelography 3.  Intraoperative fluoroscopy, under one hour, with interpretation 4.  Right ureteroscopic stone manipulation with laser lithotripsy 5.  bilateral 6 x 26 JJ stent exchange  Attending: Rosie Fate  Anesthesia: General  Estimated blood loss: None  Drains: bilateral 6 x 26 JJ ureteral stent without tether  Specimens: stone for analysis  Antibiotics: rocephin  Findings: numerous mid and distal ureteral calculi. No hydronephrosis. No masses/lesions in the bladder. Ureteral orifices in normal anatomic location.  Indications: Patient is a 56 year old male with a history of a large right renal calculus who is status post right percutaneous nephrostolithotomy and who has multiple ureteral calculi after the procedure. After discussing treatment options, they decided proceed with right ureteroscopic stone manipulation.  Procedure her in detail: The patient was brought to the operating room and a brief timeout was done to ensure correct patient, correct procedure, correct site.  General anesthesia was administered patient was placed in dorsal lithotomy position.  Her genitalia was then prepped and draped in usual sterile fashion.  A rigid 65 French cystoscope was passed in the urethra and the bladder.  Bladder was inspected free masses or lesions.  the ureteral orifices were in the normal orthotopic locations. Using a grasper the right ureteral stent was brought to the urethral meatus. Through the stent a zipwire was advanced up to the renal pelvis. The stent was then removed. a 6 french ureteral catheter was then instilled into the right ureteral orifice.  a gentle retrograde was obtained and findings noted above.  we then removed the cystoscope and cannulated the right ureteral orifice with a semirigid ureteroscope.  We located multiple stone in the mid and  distal ureter. Using a 200 nm laser fiber we fragmented the stones into smaller pieces.  the pieces were then removed with a Ngage basket.   once all stone fragments were removed we then placed a 6 x 26 double-j ureteral stent over the original zip wire.  We then removed the wire and good coil was noted in the the renal pelvis under fluoroscopy and the bladder under direct vision. the stone fragments were then removed from the bladder and sent for analysis.   the bladder was then drained and this concluded the procedure which was well tolerated by patient.  Complications: None  Condition: Stable, extubated, transferred to PACU  Plan: Patient is to be discharged home as to follow-up in 1 week for stent removal.

## 2016-06-22 MED FILL — OXYCODONE W/APAP 5/325 TAB: 5-325 | 5 days supply | Qty: 30 | Fill #0

## 2016-06-22 MED FILL — TAMSULOSIN HCL 0.4 MG CAP: 0.4 | 30 days supply | Qty: 60 | Fill #0

## 2016-07-04 DIAGNOSIS — G4733 Obstructive sleep apnea (adult) (pediatric): Secondary | ICD-10-CM | POA: Diagnosis not present

## 2016-07-13 DIAGNOSIS — Z9989 Dependence on other enabling machines and devices: Secondary | ICD-10-CM | POA: Diagnosis not present

## 2016-07-13 DIAGNOSIS — G4733 Obstructive sleep apnea (adult) (pediatric): Secondary | ICD-10-CM | POA: Diagnosis not present

## 2016-07-13 DIAGNOSIS — J3089 Other allergic rhinitis: Secondary | ICD-10-CM | POA: Diagnosis not present

## 2016-07-30 DIAGNOSIS — N200 Calculus of kidney: Secondary | ICD-10-CM | POA: Diagnosis not present

## 2016-07-31 DIAGNOSIS — Z6836 Body mass index (BMI) 36.0-36.9, adult: Secondary | ICD-10-CM | POA: Diagnosis not present

## 2016-07-31 DIAGNOSIS — Z1389 Encounter for screening for other disorder: Secondary | ICD-10-CM | POA: Diagnosis not present

## 2016-07-31 DIAGNOSIS — N200 Calculus of kidney: Secondary | ICD-10-CM | POA: Diagnosis not present

## 2016-07-31 DIAGNOSIS — M25519 Pain in unspecified shoulder: Secondary | ICD-10-CM | POA: Diagnosis not present

## 2016-07-31 DIAGNOSIS — G4700 Insomnia, unspecified: Secondary | ICD-10-CM | POA: Diagnosis not present

## 2016-07-31 DIAGNOSIS — M549 Dorsalgia, unspecified: Secondary | ICD-10-CM | POA: Diagnosis not present

## 2016-07-31 MED FILL — IBUPROFEN 600 MG TABLET: 600 | 30 days supply | Qty: 60 | Fill #0

## 2016-07-31 MED FILL — CYCLOBENZAPRINE 10 MG TAB: 10 | 30 days supply | Qty: 90 | Fill #3

## 2016-07-31 NOTE — Addendum Note (Signed)
Addendum  created 07/31/16 1303 by Myrtie Soman, MD   Sign clinical note

## 2016-07-31 NOTE — Addendum Note (Signed)
Addendum  created 07/31/16 1202 by Myrtie Soman, MD   Sign clinical note

## 2016-08-01 MED FILL — HYDROCODON-APAP 5-325: 5-325 | 30 days supply | Qty: 60 | Fill #0

## 2016-08-17 DIAGNOSIS — N200 Calculus of kidney: Secondary | ICD-10-CM | POA: Diagnosis not present

## 2016-08-17 MED FILL — FLUTICASONE PROP 50 MCG SPR: 50 | 90 days supply | Qty: 48 | Fill #3

## 2016-08-17 MED FILL — OMEPRAZOLE DR 20 MG CAPSULE: 20 | 90 days supply | Qty: 180 | Fill #2

## 2016-08-17 MED FILL — ALLOPURINOL 300 MG TABLET: 300 | 30 days supply | Qty: 30 | Fill #0

## 2016-08-17 MED FILL — MONTELUKAST SOD 10 MG TAB: 10 | 90 days supply | Qty: 90 | Fill #1

## 2016-08-22 MED FILL — TAMSULOSIN HCL 0.4 MG CAP: 0.4 | 30 days supply | Qty: 60 | Fill #0

## 2016-09-18 MED FILL — ALLOPURINOL 300 MG TABLET: 300 | 30 days supply | Qty: 30 | Fill #1

## 2016-10-02 DIAGNOSIS — N200 Calculus of kidney: Secondary | ICD-10-CM | POA: Diagnosis not present

## 2016-10-03 MED FILL — TAMSULOSIN HCL 0.4 MG CAP: 0.4 | 30 days supply | Qty: 60 | Fill #1

## 2016-10-03 MED FILL — IBUPROFEN 600 MG TABLET: 600 | 30 days supply | Qty: 60 | Fill #1

## 2016-10-03 MED FILL — GABAPENTIN 300 MG CAPS: 300 | 90 days supply | Qty: 180 | Fill #2

## 2016-10-09 MED FILL — AZITHROMYCIN 250 MG TABLET: 250 | 5 days supply | Qty: 6 | Fill #0

## 2016-10-11 DIAGNOSIS — G4733 Obstructive sleep apnea (adult) (pediatric): Secondary | ICD-10-CM | POA: Diagnosis not present

## 2016-10-19 DIAGNOSIS — N200 Calculus of kidney: Secondary | ICD-10-CM | POA: Diagnosis not present

## 2016-10-19 DIAGNOSIS — N401 Enlarged prostate with lower urinary tract symptoms: Secondary | ICD-10-CM | POA: Diagnosis not present

## 2016-10-19 DIAGNOSIS — R350 Frequency of micturition: Secondary | ICD-10-CM | POA: Diagnosis not present

## 2016-10-19 MED FILL — ALLOPURINOL 300 MG TABLET: 300 | 90 days supply | Qty: 90 | Fill #0

## 2016-10-26 DIAGNOSIS — H52223 Regular astigmatism, bilateral: Secondary | ICD-10-CM | POA: Diagnosis not present

## 2016-10-26 DIAGNOSIS — H524 Presbyopia: Secondary | ICD-10-CM | POA: Diagnosis not present

## 2016-10-26 DIAGNOSIS — H5213 Myopia, bilateral: Secondary | ICD-10-CM | POA: Diagnosis not present

## 2016-10-27 MED FILL — TAMSULOSIN HCL 0.4 MG CAP: 0.4 | 45 days supply | Qty: 90 | Fill #0

## 2016-11-17 MED FILL — OMEPRAZOLE 20 MG CAP: 20 | 90 days supply | Qty: 180 | Fill #3

## 2016-11-17 MED FILL — FLUTICASONE PROP 50 MCG SPR: 50 | 60 days supply | Qty: 32 | Fill #0 | Status: TO

## 2016-11-17 MED FILL — CYCLOBENZAPRINE 5 MG TABLET: 5 | 30 days supply | Qty: 30 | Fill #0

## 2016-11-17 MED FILL — MONTELUKAST SOD 10 MG TAB: 10 | 90 days supply | Qty: 90 | Fill #2

## 2016-12-04 DIAGNOSIS — K429 Umbilical hernia without obstruction or gangrene: Secondary | ICD-10-CM | POA: Diagnosis not present

## 2016-12-04 DIAGNOSIS — Z6836 Body mass index (BMI) 36.0-36.9, adult: Secondary | ICD-10-CM | POA: Diagnosis not present

## 2016-12-04 DIAGNOSIS — R1030 Lower abdominal pain, unspecified: Secondary | ICD-10-CM | POA: Diagnosis not present

## 2016-12-05 DIAGNOSIS — Z23 Encounter for immunization: Secondary | ICD-10-CM | POA: Diagnosis not present

## 2016-12-06 MED FILL — IBUPROFEN 600 MG TABLET: 600 | 30 days supply | Qty: 60 | Fill #2 | Status: TO

## 2016-12-11 ENCOUNTER — Encounter (HOSPITAL_BASED_OUTPATIENT_CLINIC_OR_DEPARTMENT_OTHER)
Admission: RE | Admit: 2016-12-11 | Discharge: 2016-12-11 | Disposition: A | Discharge status: Home / Self Care | Payer: 59 | Source: Ambulatory Visit | Attending: Surgery | Admitting: Surgery

## 2016-12-11 ENCOUNTER — Ambulatory Visit: Payer: Self-pay | Admitting: Surgery

## 2016-12-11 ENCOUNTER — Encounter (HOSPITAL_BASED_OUTPATIENT_CLINIC_OR_DEPARTMENT_OTHER): Payer: Self-pay | Admitting: *Deleted

## 2016-12-11 DIAGNOSIS — K429 Umbilical hernia without obstruction or gangrene: Secondary | ICD-10-CM | POA: Diagnosis not present

## 2016-12-11 MED FILL — HYDROCODON-APAP 5-325: 5-325 | 30 days supply | Qty: 60 | Fill #0

## 2016-12-11 NOTE — Progress Notes (Signed)
Patient given Ensure pre surgery with instruction to drink by 1130. Pt voiced understanding.

## 2016-12-11 NOTE — H&P (Deleted)
Buford Dresser 12/11/2016 9:40 AM Location: [facility] Patient #: [id number] DOB: 05/07/1988 Undefined / Language: Cleophus Molt / Race: Black or African American Male  History of Present Illness Marcello Moores A. Kekai Geter MD; 12/11/2016 10:20 AM) Patient words: The patient was sent at the request of Dr. Dorise Bullion for left breast mass. Patient has a history of bilateral breast masses followed by ultrasound. The one in her left breast in the upper outer quadrant is increased in size. Core biopsy showed this to be a fibroadenoma. It is increased in size by centimeter over one year. It does cause discomfort. She is currently not pregnant. The patient has complaints of dull achy breast pain. The right-sided lesions were found to be benign and are going to be followed by radiology.                         CLINICAL DATA: The patient was followed for a left breast mass in 2015 and 2016. She feels it has grown. Diffuse tenderness throughout the lateral left breast. Diffuse tenderness without a lump on the right laterally.  EXAM: ULTRASOUND OF THE LEFT BREAST  COMPARISON: Previous exam(s).  FINDINGS: On physical exam, there is a palpable lump on the left.  Targeted ultrasound is performed, showing a mass at 4 o'clock, 5 cm from the nipple measuring 3.0 x 1.5 x 2.9 cm today versus 2.0 x 1.1 x 1.9 cm previously. No axillary adenopathy on the left.  There is a mass in the right breast at 8 o'clock, 2 cm from the nipple measuring 1.2 x 0.5 x 1.9 cm today. The mass is mixed in echogenicity with parallel orientation and no posterior shadowing.  IMPRESSION: Growing left breast mass. Incidentally identified right breast mass. No cause for bilateral breast pain identified.  RECOMMENDATION: Recommend ultrasound-guided biopsy of the growing left breast mass. If the biopsy is benign, recommend six-month follow-up of the right breast mass. If the biopsy is  malignant, recommend biopsy of the right breast mass.  I have discussed the findings and recommendations with the patient. Results were also provided in writing at the conclusion of the visit. If applicable, a reminder letter will be sent to the patient regarding the next appointment.  BI-RADS CATEGORY 4: Suspicious.  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 This was found to be concordant by Dr. Lillia Mountain. Pathology results were discussed with the patient by telephone. The patient reported doing well after the biopsy with tenderness at the site. Post biopsy instructions and care were reviewed and questions were answered. The patient was encouraged to call [facility] for any additional concerns. Surgical consultation has been arranged, per patient request, with Dr. Erroll Luna at [facility] on December 11, 2016. The patient was instructed to return for right diagnostic mammography in 6 months and informed a reminder notice would be sent regarding this appointment. Pathology results reported by Terie Purser, RN on 12/06/2016. Electronically Signed By: Lillia Mountain M.D. On: 12/06/2016 08:56 Addended by Luvenia Redden, MD on 12/06/2016 9:29 AM  Study Result CLINICAL DATA: 56 year old male with an enlarging left breast mass. EXAM: ULTRASOUND GUIDED LEFT BREAST CORE NEEDLE BIOPSY COMPARISON: Previous exam(s). FINDINGS: I met with the patient and we discussed the procedure of ultrasound-guided biopsy, including benefits and alternatives. We discussed the high likelihood of a successful procedure. We discussed the  risks of the procedure, including infection, bleeding, tissue injury, clip migration, and inadequate sampling. Informed written consent was given. The usual time-out protocol was performed immediately prior to the procedure.  Lesion quadrant: Lower outer quadrant. Using sterile technique and 1% Lidocaine as local anesthetic, under direct ultrasound visualization, a 12 gauge spring-loaded device was used to perform biopsy of a mass in the 4 o'clock region of the left breast using a lateral to medial approach. At the conclusion of the procedure a HydroMARK tissue marker clip was deployed into the biopsy cavity. Follow up 2 view mammogram was performed and dictated separately. IMPRESSION: Ultrasound guided biopsy of the left breast. No apparent complications. Electronically Signed: By: Lillia Mountain M.D. On: 12/04/2016 08:02  Result History             Diagnosis Breast, left, needle core biopsy, 4:00 o'clock FIBROADENOMA NEGATIVE FOR MALIGNANCY Casimer Lanius MD Pathologist, Electronic [employer].  The patient is a 56 year old male.   Allergies (Tanisha A. Owens Shark, [place]; 12/11/2016 9:43 AM) No Known Drug Allergies 12/11/2016 Allergies Reconciled  Medication History (Tanisha A. Owens Shark, [place]; 12/11/2016 9:43 AM) HydrOXYzine HCl ('50MG'$  Tablet, Oral) Active. ClonazePAM ('1MG'$  Tablet, Oral) Active. Escitalopram Oxalate ('10MG'$  Tablet, Oral) Active. Medications Reconciled     Review of Systems (Abigayl Hor A. Halle Davlin MD; 12/11/2016 10:22 AM) General Not Present- Appetite Loss, Chills, Fatigue, Fever, Night Sweats, Weight Gain and Weight Loss. Note: All other systems negative (unless as noted in HPI & included Review of Systems) Respiratory Not Present- Bloody sputum, Chronic Cough, Difficulty Breathing, Snoring and Wheezing. Breast Note: mass Cardiovascular Not Present- Chest Pain, Difficulty Breathing Lying Down, Leg Cramps, Palpitations, Rapid Heart Rate, Shortness of Breath and Swelling of Extremities. Musculoskeletal Not Present- Back Pain, Joint Pain, Joint Stiffness, Muscle Pain, Muscle Weakness and Swelling of Extremities. Neurological Not Present- Decreased Memory, Fainting, Headaches, Numbness,  Seizures, Tingling, Tremor, Trouble walking and Weakness. Psychiatric Not Present- Anxiety, Bipolar, Change in Sleep Pattern, Depression, Fearful and Frequent crying.  Vitals (Tanisha A. Brown RMA; 12/11/2016 9:43 AM) 12/11/2016 9:42 AM Weight: 163.4 lb Height: 64in Body Surface Area: 1.8 m Body Mass Index: 28.05 kg/m  Temp.: 98.53F  Pulse: 99 (Regular)  BP: 110/68 (Sitting, Left Arm, Standard)      Physical Exam (Yazhini Mcaulay A. Kiri Hinderliter MD; 12/11/2016 10:21 AM)  General Mental Status-Alert. General Appearance-Consistent with stated age. Hydration-Well hydrated. Voice-Normal.  Head and Neck Head-normocephalic, atraumatic with no lesions or palpable masses. Trachea-midline. Thyroid Gland Characteristics - normal size and consistency.  Chest and Lung Exam Chest and lung exam reveals -quiet, even and easy respiratory effort with no use of accessory muscles and on auscultation, normal breath sounds, no adventitious sounds and normal vocal resonance. Inspection Chest Wall - Normal. Back - normal.  Breast Note: Left breast shows a 3 cm mobile mass in the left lower outer quadrant. This is about 3 cm from the nipple complex. There are no other masses in the left breast that are palpable. Right breast is normal.  Cardiovascular Cardiovascular examination reveals -normal heart sounds, regular rate and rhythm with no murmurs and normal pedal pulses bilaterally.  Lymphatic Axillary  General Axillary Region: Bilateral - Description - Normal. Tenderness - Non Tender.    Assessment & Plan (Tanya Crothers A. Neyla Gauntt MD; 12/11/2016 10:17 AM)  LEFT BREAST MASS (N63.20)  BREAST FIBROADENOMA, LEFT (D24.2) Impression: Patient desires excision due to increasing size. Risks benefits and other options discussed with the patient. Risk of bleeding, infection, cosmetic deformity, wound complications, the need for  further surgery, blood clot, stroke, DVT, cardiovascular  events, the use of seed for localization and other localization techniques discussed. She wishes to proceed.  Current Plans You are being scheduled for surgery- Our schedulers will call you.  You should hear from our office's scheduling department within 5 working days about the location, date, and time of surgery. We try to make accommodations for patient's preferences in scheduling surgery, but sometimes the OR schedule or the surgeon's schedule prevents [place] from making those accommodations.  If you have not heard from our office [phone]) in 5 working days, call the office and ask for your surgeon's nurse.  If you have other questions about your diagnosis, plan, or surgery, call the office and ask for your surgeon's nurse.  Pt Education - [facility] Breast Biopsy HCI: discussed with patient and provided information.

## 2016-12-12 ENCOUNTER — Encounter (HOSPITAL_BASED_OUTPATIENT_CLINIC_OR_DEPARTMENT_OTHER): Payer: Self-pay | Admitting: *Deleted

## 2016-12-12 ENCOUNTER — Encounter (HOSPITAL_BASED_OUTPATIENT_CLINIC_OR_DEPARTMENT_OTHER)
Admission: RE | Disposition: A | Discharge status: Home / Self Care | Payer: Self-pay | Source: Ambulatory Visit | Attending: Surgery

## 2016-12-12 ENCOUNTER — Ambulatory Visit (HOSPITAL_BASED_OUTPATIENT_CLINIC_OR_DEPARTMENT_OTHER): Payer: 59 | Admitting: Certified Registered"

## 2016-12-12 ENCOUNTER — Ambulatory Visit (HOSPITAL_BASED_OUTPATIENT_CLINIC_OR_DEPARTMENT_OTHER)
Admission: RE | Admit: 2016-12-12 | Discharge: 2016-12-12 | Disposition: A | Discharge status: Home / Self Care | Payer: 59 | Source: Ambulatory Visit | Attending: Surgery | Admitting: Surgery

## 2016-12-12 DIAGNOSIS — Z6834 Body mass index (BMI) 34.0-34.9, adult: Secondary | ICD-10-CM | POA: Insufficient documentation

## 2016-12-12 DIAGNOSIS — Z888 Allergy status to other drugs, medicaments and biological substances status: Secondary | ICD-10-CM | POA: Diagnosis not present

## 2016-12-12 DIAGNOSIS — G4730 Sleep apnea, unspecified: Secondary | ICD-10-CM | POA: Insufficient documentation

## 2016-12-12 DIAGNOSIS — Z880 Allergy status to penicillin: Secondary | ICD-10-CM | POA: Diagnosis not present

## 2016-12-12 DIAGNOSIS — J45909 Unspecified asthma, uncomplicated: Secondary | ICD-10-CM | POA: Diagnosis not present

## 2016-12-12 DIAGNOSIS — K420 Umbilical hernia with obstruction, without gangrene: Secondary | ICD-10-CM | POA: Diagnosis not present

## 2016-12-12 DIAGNOSIS — Z9989 Dependence on other enabling machines and devices: Secondary | ICD-10-CM | POA: Insufficient documentation

## 2016-12-12 DIAGNOSIS — N6310 Unspecified lump in the right breast, unspecified quadrant: Secondary | ICD-10-CM | POA: Diagnosis not present

## 2016-12-12 DIAGNOSIS — Z79899 Other long term (current) drug therapy: Secondary | ICD-10-CM | POA: Insufficient documentation

## 2016-12-12 DIAGNOSIS — N200 Calculus of kidney: Secondary | ICD-10-CM | POA: Diagnosis not present

## 2016-12-12 DIAGNOSIS — K436 Other and unspecified ventral hernia with obstruction, without gangrene: Secondary | ICD-10-CM | POA: Diagnosis not present

## 2016-12-12 DIAGNOSIS — N6320 Unspecified lump in the left breast, unspecified quadrant: Secondary | ICD-10-CM | POA: Diagnosis not present

## 2016-12-12 HISTORY — PX: UMBILICAL HERNIA REPAIR: SHX196

## 2016-12-12 HISTORY — DX: Allergy status to unspecified drugs, medicaments and biological substances: Z88.9

## 2016-12-12 HISTORY — DX: Umbilical hernia without obstruction or gangrene: K42.9

## 2016-12-12 SURGERY — REPAIR, HERNIA, UMBILICAL, ADULT
Anesthesia: General | Site: Abdomen

## 2016-12-12 MED ORDER — BUPIVACAINE-EPINEPHRINE (PF) 0.25% -1:200000 IJ SOLN
INTRAMUSCULAR | Status: AC
  Filled 2016-12-12: qty 30

## 2016-12-12 MED ORDER — CIPROFLOXACIN IN D5W 400 MG/200ML IV SOLN
400.0000 mg | INTRAVENOUS | Status: AC
  Administered 2016-12-12: 400 mg via INTRAVENOUS

## 2016-12-12 MED ORDER — SUCCINYLCHOLINE CHLORIDE 20 MG/ML IJ SOLN
INTRAMUSCULAR | Status: DC | PRN
  Administered 2016-12-12: 120 mg via INTRAVENOUS

## 2016-12-12 MED ORDER — HYDROMORPHONE HCL 1 MG/ML IJ SOLN
INTRAMUSCULAR | Status: AC
  Filled 2016-12-12: qty 0.5

## 2016-12-12 MED ORDER — MIDAZOLAM HCL 2 MG/2ML IJ SOLN
INTRAMUSCULAR | Status: AC
  Filled 2016-12-12: qty 2

## 2016-12-12 MED ORDER — PROPOFOL 10 MG/ML IV BOLUS
INTRAVENOUS | Status: AC
  Filled 2016-12-12: qty 40

## 2016-12-12 MED ORDER — BUPIVACAINE-EPINEPHRINE 0.25% -1:200000 IJ SOLN
INTRAMUSCULAR | Status: DC | PRN
  Administered 2016-12-12: 8 mL

## 2016-12-12 MED ORDER — MIDAZOLAM HCL 2 MG/2ML IJ SOLN
1.0000 mg | INTRAMUSCULAR | Status: DC | PRN
  Administered 2016-12-12: 2 mg via INTRAVENOUS

## 2016-12-12 MED ORDER — ACETAMINOPHEN 500 MG PO TABS
ORAL_TABLET | ORAL | Status: AC
  Filled 2016-12-12: qty 2

## 2016-12-12 MED ORDER — GABAPENTIN 300 MG PO CAPS
300.0000 mg | ORAL_CAPSULE | ORAL | Status: AC
  Administered 2016-12-12: 300 mg via ORAL

## 2016-12-12 MED ORDER — DEXAMETHASONE SODIUM PHOSPHATE 10 MG/ML IJ SOLN
INTRAMUSCULAR | Status: AC
  Filled 2016-12-12: qty 1

## 2016-12-12 MED ORDER — DEXAMETHASONE SODIUM PHOSPHATE 4 MG/ML IJ SOLN
INTRAMUSCULAR | Status: DC | PRN
  Administered 2016-12-12: 10 mg via INTRAVENOUS

## 2016-12-12 MED ORDER — CHLORHEXIDINE GLUCONATE CLOTH 2 % EX PADS: 6.0000 | MEDICATED_PAD | Freq: Once | CUTANEOUS | Status: DC

## 2016-12-12 MED ORDER — GABAPENTIN 300 MG PO CAPS
ORAL_CAPSULE | ORAL | Status: AC
  Filled 2016-12-12: qty 1

## 2016-12-12 MED ORDER — SCOPOLAMINE 1 MG/3DAYS TD PT72: 1.0000 | MEDICATED_PATCH | Freq: Once | TRANSDERMAL | Status: DC | PRN

## 2016-12-12 MED ORDER — LIDOCAINE 2% (20 MG/ML) 5 ML SYRINGE
INTRAMUSCULAR | Status: AC
  Filled 2016-12-12: qty 5

## 2016-12-12 MED ORDER — FENTANYL CITRATE (PF) 100 MCG/2ML IJ SOLN
INTRAMUSCULAR | Status: AC
  Filled 2016-12-12: qty 2

## 2016-12-12 MED ORDER — FENTANYL CITRATE (PF) 100 MCG/2ML IJ SOLN
50.0000 ug | INTRAMUSCULAR | Status: DC | PRN
  Administered 2016-12-12: 100 ug via INTRAVENOUS

## 2016-12-12 MED ORDER — CELECOXIB 200 MG PO CAPS
200.0000 mg | ORAL_CAPSULE | ORAL | Status: AC
  Administered 2016-12-12: 200 mg via ORAL

## 2016-12-12 MED ORDER — ONDANSETRON HCL 4 MG/2ML IJ SOLN
INTRAMUSCULAR | Status: AC
  Filled 2016-12-12: qty 2

## 2016-12-12 MED ORDER — ROCURONIUM BROMIDE 10 MG/ML (PF) SYRINGE
PREFILLED_SYRINGE | INTRAVENOUS | Status: AC
  Filled 2016-12-12: qty 5

## 2016-12-12 MED ORDER — PROMETHAZINE HCL 25 MG/ML IJ SOLN: 6.2500 mg | INTRAMUSCULAR | Status: DC | PRN

## 2016-12-12 MED ORDER — HYDROMORPHONE HCL 1 MG/ML IJ SOLN
0.2500 mg | INTRAMUSCULAR | Status: DC | PRN
  Administered 2016-12-12: 0.5 mg via INTRAVENOUS

## 2016-12-12 MED ORDER — SUGAMMADEX SODIUM 500 MG/5ML IV SOLN
INTRAVENOUS | Status: DC | PRN
  Administered 2016-12-12: 350 mg via INTRAVENOUS

## 2016-12-12 MED ORDER — LIDOCAINE 2% (20 MG/ML) 5 ML SYRINGE
INTRAMUSCULAR | Status: DC | PRN
  Administered 2016-12-12: 100 mg via INTRAVENOUS

## 2016-12-12 MED ORDER — CELECOXIB 200 MG PO CAPS
ORAL_CAPSULE | ORAL | Status: AC
  Filled 2016-12-12: qty 1

## 2016-12-12 MED ORDER — CIPROFLOXACIN IN D5W 400 MG/200ML IV SOLN
INTRAVENOUS | Status: AC
  Filled 2016-12-12: qty 200

## 2016-12-12 MED ORDER — LACTATED RINGERS IV SOLN
INTRAVENOUS | Status: DC
  Administered 2016-12-12: 14:00:00 via INTRAVENOUS

## 2016-12-12 MED ORDER — ROCURONIUM BROMIDE 100 MG/10ML IV SOLN
INTRAVENOUS | Status: DC | PRN
  Administered 2016-12-12: 30 mg via INTRAVENOUS
  Administered 2016-12-12: 20 mg via INTRAVENOUS

## 2016-12-12 MED ORDER — SUGAMMADEX SODIUM 500 MG/5ML IV SOLN
INTRAVENOUS | Status: AC
  Filled 2016-12-12: qty 5

## 2016-12-12 MED ORDER — NEOSTIGMINE METHYLSULFATE 5 MG/5ML IV SOSY
PREFILLED_SYRINGE | INTRAVENOUS | Status: AC
  Filled 2016-12-12: qty 5

## 2016-12-12 MED ORDER — ACETAMINOPHEN 500 MG PO TABS
1000.0000 mg | ORAL_TABLET | ORAL | Status: AC
  Administered 2016-12-12: 1000 mg via ORAL

## 2016-12-12 MED ORDER — PROPOFOL 10 MG/ML IV BOLUS
INTRAVENOUS | Status: DC | PRN
  Administered 2016-12-12: 200 mg via INTRAVENOUS

## 2016-12-12 SURGICAL SUPPLY — 48 items
BENZOIN TINCTURE PRP APPL 2/3 (GAUZE/BANDAGES/DRESSINGS) IMPLANT
BINDER ABDOMINAL 12 SM 30-45 (SOFTGOODS) ×3 IMPLANT
BLADE CLIPPER SURG (BLADE) ×3 IMPLANT
BLADE SURG 10 STRL SS (BLADE) ×3 IMPLANT
BLADE SURG 15 STRL LF DISP TIS (BLADE) ×2 IMPLANT
BLADE SURG 15 STRL SS (BLADE) ×1
CANISTER SUCT 1200ML W/VALVE (MISCELLANEOUS) IMPLANT
CHLORAPREP W/TINT 26ML (MISCELLANEOUS) ×3 IMPLANT
COVER BACK TABLE 60X90IN (DRAPES) ×3 IMPLANT
COVER MAYO STAND STRL (DRAPES) ×3 IMPLANT
DECANTER SPIKE VIAL GLASS SM (MISCELLANEOUS) IMPLANT
DERMABOND ADVANCED (GAUZE/BANDAGES/DRESSINGS) ×1
DERMABOND ADVANCED .7 DNX12 (GAUZE/BANDAGES/DRESSINGS) ×2 IMPLANT
DRAPE LAPAROTOMY TRNSV 102X78 (DRAPE) ×3 IMPLANT
DRAPE UTILITY XL STRL (DRAPES) ×3 IMPLANT
DRSG TEGADERM 4X4.75 (GAUZE/BANDAGES/DRESSINGS) IMPLANT
ELECT COATED BLADE 2.86 ST (ELECTRODE) ×3 IMPLANT
ELECT REM PT RETURN 9FT ADLT (ELECTROSURGICAL) ×3
ELECTRODE REM PT RTRN 9FT ADLT (ELECTROSURGICAL) ×2 IMPLANT
GLOVE BIOGEL PI IND STRL 7.0 (GLOVE) ×2 IMPLANT
GLOVE BIOGEL PI IND STRL 8 (GLOVE) ×2 IMPLANT
GLOVE BIOGEL PI INDICATOR 7.0 (GLOVE) ×1
GLOVE BIOGEL PI INDICATOR 8 (GLOVE) ×1
GLOVE ECLIPSE 6.5 STRL STRAW (GLOVE) ×3 IMPLANT
GLOVE ECLIPSE 8.0 STRL XLNG CF (GLOVE) ×3 IMPLANT
GLOVE EXAM NITRILE MD LF STRL (GLOVE) ×3 IMPLANT
GOWN STRL REUS W/ TWL LRG LVL3 (GOWN DISPOSABLE) ×6 IMPLANT
GOWN STRL REUS W/TWL LRG LVL3 (GOWN DISPOSABLE) ×3
NEEDLE HYPO 22GX1.5 SAFETY (NEEDLE) IMPLANT
NEEDLE HYPO 25X1 1.5 SAFETY (NEEDLE) ×3 IMPLANT
NS IRRIG 1000ML POUR BTL (IV SOLUTION) ×3 IMPLANT
PACK BASIN DAY SURGERY FS (CUSTOM PROCEDURE TRAY) ×3 IMPLANT
PENCIL BUTTON HOLSTER BLD 10FT (ELECTRODE) ×3 IMPLANT
SLEEVE SCD COMPRESS KNEE MED (MISCELLANEOUS) ×3 IMPLANT
STAPLER VISISTAT 35W (STAPLE) IMPLANT
STRIP CLOSURE SKIN 1/2X4 (GAUZE/BANDAGES/DRESSINGS) IMPLANT
SUT MON AB 4-0 PC3 18 (SUTURE) ×3 IMPLANT
SUT NOVA NAB GS-21 1 T12 (SUTURE) ×3 IMPLANT
SUT NOVA NAB GS-22 2 0 T19 (SUTURE) IMPLANT
SUT SILK 3 0 SH 30 (SUTURE) IMPLANT
SUT VIC AB 2-0 SH 27 (SUTURE) ×1
SUT VIC AB 2-0 SH 27XBRD (SUTURE) ×2 IMPLANT
SUT VICRYL 3-0 CR8 SH (SUTURE) ×3 IMPLANT
SYR CONTROL 10ML LL (SYRINGE) ×3 IMPLANT
TOWEL OR 17X24 6PK STRL BLUE (TOWEL DISPOSABLE) ×3 IMPLANT
TOWEL OR NON WOVEN STRL DISP B (DISPOSABLE) IMPLANT
TUBE CONNECTING 20X1/4 (TUBING) IMPLANT
YANKAUER SUCT BULB TIP NO VENT (SUCTIONS) IMPLANT

## 2016-12-12 NOTE — Discharge Instructions (Signed)
[facility] _______[facility] ° °UMBILICAL OR INGUINAL HERNIA REPAIR: POST OP INSTRUCTIONS ° °Always review your discharge instruction sheet given to you by the facility where your surgery was performed. °IF YOU HAVE DISABILITY OR FAMILY LEAVE FORMS, YOU MUST BRING THEM TO THE OFFICE FOR PROCESSING.   °DO NOT GIVE THEM TO YOUR DOCTOR. ° °1. A  prescription for pain medication may be given to you upon discharge.  Take your pain medication as prescribed, if needed.  If narcotic pain medicine is not needed, then you may take acetaminophen (Tylenol) or ibuprofen (Advil) as needed. °2. Take your usually prescribed medications unless otherwise directed. °If you need a refill on your pain medication, please contact your pharmacy.  They will contact our office to request authorization. Prescriptions will not be filled after 5 pm or on week-ends. °3. You should follow a light diet the first 24 hours after arrival home, such as soup and crackers, etc.  Be sure to include lots of fluids daily.  Resume your normal diet the day after surgery. °4.Most patients will experience some swelling and bruising around the umbilicus or in the groin and scrotum.  Ice packs and reclining will help.  Swelling and bruising can take several days to resolve.  °6. It is common to experience some constipation if taking pain medication after surgery.  Increasing fluid intake and taking a stool softener (such as Colace) will usually help or prevent this problem from occurring.  A mild laxative (Milk of Magnesia or Miralax) should be taken according to package directions if there are no bowel movements after 48 hours. °7. Unless discharge instructions indicate otherwise, you may remove your bandages 24-48 hours after surgery, and you may shower at that time.  You may have steri-strips (small skin tapes) in place directly over the incision.  These strips should be left on the skin for 7-10 days.  If your surgeon used skin glue on the  incision, you may shower in 24 hours.  The glue will flake off over the next 2-3 weeks.  Any sutures or staples will be removed at the office during your follow-up visit. °8. ACTIVITIES:  You may resume regular (light) daily activities beginning the next day--such as daily self-care, walking, climbing stairs--gradually increasing activities as tolerated.  You may have sexual intercourse when it is comfortable.  Refrain from any heavy lifting or straining until approved by your doctor. ° °a.You may drive when you are no longer taking prescription pain medication, you can comfortably wear a seatbelt, and you can safely maneuver your car and apply brakes. °b.RETURN TO WORK:   °_____________________________________________ ° °9.You should see your doctor in the office for a follow-up appointment approximately 2-3 weeks after your surgery.  Make sure that you call for this appointment within a day or two after you arrive home to insure a convenient appointment time. °10.OTHER INSTRUCTIONS: _________________________ °   _____________________________________ ° °WHEN TO CALL YOUR DOCTOR: °1. Fever over 101.0 °2. Inability to urinate °3. Nausea and/or vomiting °4. Extreme swelling or bruising °5. Continued bleeding from incision. °6. Increased pain, redness, or drainage from the incision ° °The clinic staff is available to answer your questions during regular business hours.  Please don’t hesitate to call and ask to speak to one of the nurses for clinical concerns.  If you have a medical emergency, go to the nearest emergency room or call 911.  A surgeon from [facility] is always on call at the hospital ° ° °  [address], [address], [place], [place]  [address] ? ° [address] [address], [place], [place]   [address] °[phone] ? [phone] ? [phone] [phone] °Web site: [email or web address] ° ° °Post Anesthesia Home Care Instructions ° °Activity: °Get plenty of rest for the remainder of the day. A  responsible individual must stay with you for 24 hours following the procedure.  °For the next 24 hours, DO NOT: °-Drive a car °-Operate machinery °-Drink alcoholic beverages °-Take any medication unless instructed by your physician °-Make any legal decisions or sign important papers. ° °Meals: °Start with liquid foods such as gelatin or soup. Progress to regular foods as tolerated. Avoid greasy, spicy, heavy foods. If nausea and/or vomiting occur, drink only clear liquids until the nausea and/or vomiting subsides. Call your physician if vomiting continues. ° °Special Instructions/Symptoms: °Your throat may feel dry or sore from the anesthesia or the breathing tube placed in your throat during surgery. If this causes discomfort, gargle with warm salt water. The discomfort should disappear within 24 hours. ° °If you had a scopolamine patch placed behind your ear for the management of post- operative nausea and/or vomiting: ° °1. The medication in the patch is effective for 72 hours, after which it should be removed.  Wrap patch in a tissue and discard in the trash. Wash hands thoroughly with soap and water. °2. You may remove the patch earlier than 72 hours if you experience unpleasant side effects which may include dry mouth, dizziness or visual disturbances. °3. Avoid touching the patch. Wash your hands with soap and water after contact with the patch. °  ° °

## 2016-12-12 NOTE — H&P (View-Only) (Signed)
Gregory [name] 12/11/2016 9:40 AM Location: [facility] Patient #: [id number] DOB: 05/07/1988 Undefined / Language: Cleophus Molt / Race: Black or African American Male  History of Present Illness Marcello Moores A. Chika Cichowski MD; 12/11/2016 10:20 AM) Patient words: The patient was sent at the request of Dr. Dorise Bullion for left breast mass. Patient has a history of bilateral breast masses followed by ultrasound. The one in her left breast in the upper outer quadrant is increased in size. Core biopsy showed this to be a fibroadenoma. It is increased in size by centimeter over one year. It does cause discomfort. She is currently not pregnant. The patient has complaints of dull achy breast pain. The right-sided lesions were found to be benign and are going to be followed by radiology.                         CLINICAL DATA: The patient was followed for a left breast mass in 2015 and 2016. She feels it has grown. Diffuse tenderness throughout the lateral left breast. Diffuse tenderness without a lump on the right laterally.  EXAM: ULTRASOUND OF THE LEFT BREAST  COMPARISON: Previous exam(s).  FINDINGS: On physical exam, there is a palpable lump on the left.  Targeted ultrasound is performed, showing a mass at 4 o'clock, 5 cm from the nipple measuring 3.0 x 1.5 x 2.9 cm today versus 2.0 x 1.1 x 1.9 cm previously. No axillary adenopathy on the left.  There is a mass in the right breast at 8 o'clock, 2 cm from the nipple measuring 1.2 x 0.5 x 1.9 cm today. The mass is mixed in echogenicity with parallel orientation and no posterior shadowing.  IMPRESSION: Growing left breast mass. Incidentally identified right breast mass. No cause for bilateral breast pain identified.  RECOMMENDATION: Recommend ultrasound-guided biopsy of the growing left breast mass. If the biopsy is benign, recommend six-month follow-up of the right breast mass. If the biopsy is  malignant, recommend biopsy of the right breast mass.  I have discussed the findings and recommendations with the patient. Results were also provided in writing at the conclusion of the visit. If applicable, a reminder letter will be sent to the patient regarding the next appointment.  BI-RADS CATEGORY 4: Suspicious.  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 This was found to be concordant by Dr. Lillia Mountain. Pathology results were discussed with the patient by telephone. The patient reported doing well after the biopsy with tenderness at the site. Post biopsy instructions and care were reviewed and questions were answered. The patient was encouraged to call [facility] for any additional concerns. Surgical consultation has been arranged, per patient request, with Dr. Erroll Luna at [facility] on December 11, 2016. The patient was instructed to return for right diagnostic mammography in 6 months and informed a reminder notice would be sent regarding this appointment. Pathology results reported by Terie Purser, RN on 12/06/2016. Electronically Signed By: Lillia Mountain M.D. On: 12/06/2016 08:56 Addended by Luvenia Redden, MD on 12/06/2016 9:29 AM  Study Result CLINICAL DATA: 56 year old male with an enlarging left breast mass. EXAM: ULTRASOUND GUIDED LEFT BREAST CORE NEEDLE BIOPSY COMPARISON: Previous exam(s). FINDINGS: I met with the patient and we discussed the procedure of ultrasound-guided biopsy, including benefits and alternatives. We discussed the high likelihood of a successful procedure. We discussed the  risks of the procedure, including infection, bleeding, tissue injury, clip migration, and inadequate sampling. Informed written consent was given. The usual time-out protocol was performed immediately prior to the procedure.  Lesion quadrant: Lower outer quadrant. Using sterile technique and 1% Lidocaine as local anesthetic, under direct ultrasound visualization, a 12 gauge spring-loaded device was used to perform biopsy of a mass in the 4 o'clock region of the left breast using a lateral to medial approach. At the conclusion of the procedure a HydroMARK tissue marker clip was deployed into the biopsy cavity. Follow up 2 view mammogram was performed and dictated separately. IMPRESSION: Ultrasound guided biopsy of the left breast. No apparent complications. Electronically Signed: By: Lillia Mountain M.D. On: 12/04/2016 08:02  Result History             Diagnosis Breast, left, needle core biopsy, 4:00 o'clock FIBROADENOMA NEGATIVE FOR MALIGNANCY Casimer Lanius MD Pathologist, Electronic Signature.  The patient is a 56 year old male.   Allergies (Tanisha A. Owens Shark, [place]; 12/11/2016 9:43 AM) No Known Drug Allergies 12/11/2016 Allergies Reconciled  Medication History (Tanisha A. Owens Shark, [place]; 12/11/2016 9:43 AM) HydrOXYzine HCl ('50MG'$  Tablet, Oral) Active. ClonazePAM ('1MG'$  Tablet, Oral) Active. Escitalopram Oxalate ('10MG'$  Tablet, Oral) Active. Medications Reconciled     Review of Systems (Cataldo Cosgriff A. Hedy Garro MD; 12/11/2016 10:22 AM) General Not Present- Appetite Loss, Chills, Fatigue, Fever, Night Sweats, Weight Gain and Weight Loss. Note: All other systems negative (unless as noted in HPI & included Review of Systems) Respiratory Not Present- Bloody sputum, Chronic Cough, Difficulty Breathing, Snoring and Wheezing. Breast Note: mass Cardiovascular Not Present- Chest Pain, Difficulty Breathing Lying Down, Leg Cramps, Palpitations, Rapid Heart Rate, Shortness of Breath and Swelling of Extremities. Musculoskeletal Not Present- Back Pain, Joint Pain, Joint Stiffness, Muscle Pain, Muscle Weakness and Swelling of Extremities. Neurological Not Present- Decreased Memory, Fainting, Headaches, Numbness,  Seizures, Tingling, Tremor, Trouble walking and Weakness. Psychiatric Not Present- Anxiety, Bipolar, Change in Sleep Pattern, Depression, Fearful and Frequent crying.  Vitals (Tanisha A. Brown RMA; 12/11/2016 9:43 AM) 12/11/2016 9:42 AM Weight: 163.4 lb Height: 64in Body Surface Area: 1.8 m Body Mass Index: 28.05 kg/m  Temp.: 98.16F  Pulse: 99 (Regular)  BP: 110/68 (Sitting, Left Arm, Standard)      Physical Exam (Chace Klippel A. Joshuan Bolander MD; 12/11/2016 10:21 AM)  General Mental Status-Alert. General Appearance-Consistent with stated age. Hydration-Well hydrated. Voice-Normal.  Head and Neck Head-normocephalic, atraumatic with no lesions or palpable masses. Trachea-midline. Thyroid Gland Characteristics - normal size and consistency.  Chest and Lung Exam Chest and lung exam reveals -quiet, even and easy respiratory effort with no use of accessory muscles and on auscultation, normal breath sounds, no adventitious sounds and normal vocal resonance. Inspection Chest Wall - Normal. Back - normal.  Breast Note: Left breast shows a 3 cm mobile mass in the left lower outer quadrant. This is about 3 cm from the nipple complex. There are no other masses in the left breast that are palpable. Right breast is normal.  Cardiovascular Cardiovascular examination reveals -normal heart sounds, regular rate and rhythm with no murmurs and normal pedal pulses bilaterally.  Lymphatic Axillary  General Axillary Region: Bilateral - Description - Normal. Tenderness - Non Tender.    Assessment & Plan (Laine Giovanetti A. Cristoval Teall MD; 12/11/2016 10:17 AM)  LEFT BREAST MASS (N63.20)  BREAST FIBROADENOMA, LEFT (D24.2) Impression: Patient desires excision due to increasing size. Risks benefits and other options discussed with the patient. Risk of bleeding, infection, cosmetic deformity, wound complications, the need for  further surgery, blood clot, stroke, DVT, cardiovascular  events, the use of seed for localization and other localization techniques discussed. She wishes to proceed.  Current Plans You are being scheduled for surgery- Our schedulers will call you.  You should hear from our office's scheduling department within 5 working days about the location, date, and time of surgery. We try to make accommodations for patient's preferences in scheduling surgery, but sometimes the OR schedule or the surgeon's schedule prevents [place] from making those accommodations.  If you have not heard from our office [phone]) in 5 working days, call the office and ask for your surgeon's nurse.  If you have other questions about your diagnosis, plan, or surgery, call the office and ask for your surgeon's nurse.  Pt Education - [facility] Breast Biopsy HCI: discussed with patient and provided information.

## 2016-12-12 NOTE — Op Note (Signed)
Gregory [name] 1960/04/16 [id number] 12/12/2016  Preoperative diagnosis: incarcerated umbilical hernia  Postoperative diagnosis: same  Procedure: Umbilical Hernia Repair  Surgeon: Turner Daniels, MD, FACS  Anesthesia: General and local   Clinical History and Indications: the patient presents with incarcerated umbilical hernia.he was seen yesterday and is reducible but is incarcerated today. He is having more pain. He desires repair. We discussed repair with and without mesh. The pros and cons of using mesh were discussed.The risk of hernia repair include bleeding,  Infection,   Recurrence of the hernia,  Mesh use, chronic pain,  Organ injury,  Bowel injury,  Bladder injury,   nerve injury with numbness around the incision,  Death,  and worsening of preexisting  medical problems.  The alternatives to surgery have been discussed as well..  Long term expectations of both operative and non operative treatments have been discussed.   The patient agrees to proceed.  Procedure: The patient was seen in the preoperative area and the plans for the procedure reviewed again. He  had no further questions. I marked the area of the umbilicus as the operative site. He wishes to prodeed.  The patient was taken to the operating room and after satisfactory general anesthesia had been obtained the area was clipped as needed, prepped and draped. The timeout was performed.  I used some quarter percent Marcaine anesthesia to help with postoperative pain management. This was infiltrated around the umbilical area and additional infiltrated as I worked.  A curvilinear incision was made on the inferior aspect of the umbilicus. The umbilical skin was elevated off of the hernia sac. The hernia sac was dissected free of the subcutaneous tissues.  The sac contained preperitoneal fat was reduced back into the abdominal cavity. This was incarcerated but viable. The defect was approximately 1 cm was closed with #1  Novafil suture. 3 sutures were used to close the defect. Hemostasis was achieved. 2-0 Vicryl was used to close subcutaneous tissues.  Once the repair was complete the incision was closed by using some 3-0 Vicryl subcutaneous and 4-0 Monocryl subcuticular sutures. Dermabond applied.  The patient tolerated the procedure well. There were no operative complications. There was minimal blood loss. All counts were correct. He was taken to the PACU in satisfactory condition.  Turner Daniels, MD, FACS 12/12/2016 3:38 PM

## 2016-12-12 NOTE — Anesthesia Postprocedure Evaluation (Signed)
Anesthesia Post Note  Patient: Gregory [name]  Procedure(s) Performed: UMBILICAL HERNIA REPAIR (N/A Abdomen)     Patient location during evaluation: PACU Anesthesia Type: General Level of consciousness: awake and alert Pain management: pain level controlled Vital Signs Assessment: post-procedure vital signs reviewed and stable Respiratory status: spontaneous breathing, nonlabored ventilation and respiratory function stable Cardiovascular status: blood pressure returned to baseline and stable Postop Assessment: no apparent nausea or vomiting Anesthetic complications: no    Last Vitals:  Vitals:   12/12/16 1615 12/12/16 1630  BP: 129/78 130/77  Pulse: 80 74  Resp: 13 17  Temp:    SpO2: 100% 98%    Last Pain:  Vitals:   12/12/16 1630  TempSrc:   PainSc: 5                  Maureen Duesing A.

## 2016-12-12 NOTE — Anesthesia Preprocedure Evaluation (Signed)
Anesthesia Evaluation  Patient identified by MRN, date of birth, ID band Patient awake    Reviewed: Allergy & Precautions, NPO status , Patient's Chart, lab work & pertinent test results  History of Anesthesia Complications Negative for: history of anesthetic complications  Airway Mallampati: II  TM Distance: >3 FB Neck ROM: Full    Dental no notable dental hx. (+) Dental Advisory Given   Pulmonary asthma , sleep apnea and Continuous Positive Airway Pressure Ventilation ,    Pulmonary exam normal breath sounds clear to auscultation       Cardiovascular negative cardio ROS Normal cardiovascular exam Rhythm:Regular Rate:Normal     Neuro/Psych negative neurological ROS  negative psych ROS   GI/Hepatic Neg liver ROS, Medicated,  Endo/Other  Morbid obesity  Renal/GU Renal disease  negative genitourinary   Musculoskeletal negative musculoskeletal ROS (+)   Abdominal   Peds negative pediatric ROS (+)  Hematology negative hematology ROS (+)   Anesthesia Other Findings   Reproductive/Obstetrics negative OB ROS                             Anesthesia Physical  Anesthesia Plan  ASA: II  Anesthesia Plan: General   Post-op Pain Management:    Induction: Intravenous  PONV Risk Score and Plan: 2 and Ondansetron and Dexamethasone  Airway Management Planned: LMA and Oral ETT  Additional Equipment:   Intra-op Plan:   Post-operative Plan: Extubation in OR  Informed Consent: I have reviewed the patients History and Physical, chart, labs and discussed the procedure including the risks, benefits and alternatives for the proposed anesthesia with the patient or authorized representative who has indicated his/her understanding and acceptance.   Dental advisory given  Plan Discussed with: CRNA, Surgeon and Anesthesiologist  Anesthesia Plan Comments:         Anesthesia Quick  Evaluation

## 2016-12-12 NOTE — Interval H&P Note (Signed)
History and Physical Interval Note:  12/12/2016 2:25 PM  Gregory [name]  has presented today for surgery, with the diagnosis of UMBILICAL HERNIA  The various methods of treatment have been discussed with the patient and family. After consideration of risks, benefits and other options for treatment, the patient has consented to  Procedure(s): UMBILICAL HERNIA REPAIR WITH MESH (N/A) INSERTION OF MESH (N/A) as a surgical intervention .  The patient's history has been reviewed, patient examined, no change in status, stable for surgery.  I have reviewed the patient's chart and labs.  Questions were answered to the patient's satisfaction.     Macallan Ord A.

## 2016-12-12 NOTE — Transfer of Care (Signed)
Immediate Anesthesia Transfer of Care Note  Patient: Gregory [name]  Procedure(s) Performed: UMBILICAL HERNIA REPAIR (N/A Abdomen)  Patient Location: PACU  Anesthesia Type:General  Level of Consciousness: sedated  Airway & Oxygen Therapy: Patient Spontanous Breathing and Patient connected to T-piece oxygen  Post-op Assessment: Report given to RN and Post -op Vital signs reviewed and stable  Post vital signs: Reviewed and stable  Last Vitals:  Vitals:   12/12/16 1407 12/12/16 1551  BP: 137/82   Pulse: (!) 56   Resp: 18   Temp: 36.5 C (!) (P) 36.4 C  SpO2: 99%     Last Pain:  Vitals:   12/12/16 1407  TempSrc: Oral         Complications: No apparent anesthesia complications

## 2016-12-12 NOTE — Anesthesia Procedure Notes (Signed)
Procedure Name: Intubation Date/Time: 12/12/2016 3:00 PM Performed by: Lieutenant Diego Pre-anesthesia Checklist: Patient identified, Emergency Drugs available, Suction available and Patient being monitored Patient Re-evaluated:Patient Re-evaluated prior to induction Oxygen Delivery Method: Circle system utilized Preoxygenation: Pre-oxygenation with 100% oxygen Induction Type: IV induction Ventilation: Mask ventilation without difficulty Laryngoscope Size: Miller and 2 Grade View: Grade II Tube type: Oral Tube size: 7.0 mm Number of attempts: 2 Airway Equipment and Method: Stylet and Oral airway Placement Confirmation: ETT inserted through vocal cords under direct vision,  positive ETCO2 and breath sounds checked- equal and bilateral Secured at: 25 cm Tube secured with: Tape Dental Injury: Teeth and Oropharynx as per pre-operative assessment  Difficulty Due To: Difficulty was anticipated, Difficult Airway- due to limited oral opening and Difficult Airway- due to dentition Future Recommendations: Recommend- induction with short-acting agent, and alternative techniques readily available Comments: Small mouth, no view with Miller 2, grade 2 with Miller 3.

## 2016-12-13 ENCOUNTER — Encounter (HOSPITAL_BASED_OUTPATIENT_CLINIC_OR_DEPARTMENT_OTHER): Payer: Self-pay | Admitting: Surgery

## 2016-12-13 NOTE — Progress Notes (Signed)
Pt's wife called concerned about pts abdomen.  States he is distended and tight.  Bowel sounds present per wife, who is a Marine scientist.  Pulse ox 98% on room air, pt is able to pass gas and have a bowel movement, no rebound tenderness noted, incision site without issues, no swelling when pt bears down or attempts abdominal crunch. Pain is tolerable on Vicodin. Pt is up and about walking,  Wife states he says he has difficulty taking a deep breath. Encouraged pt to walk, take medication for constipation. No drinking with straws. Notify MD tonight of symptoms and see if he wants to do an xray of abdomen tonight in the ED.

## 2016-12-21 MED FILL — TAMSULOSIN HCL 0.4 MG CAP: 0.4 | 45 days supply | Qty: 90 | Fill #1

## 2016-12-21 MED FILL — CYCLOBENZAPRINE 5 MG TABLET: 5 | 30 days supply | Qty: 30 | Fill #1 | Status: TO

## 2016-12-22 DIAGNOSIS — Z125 Encounter for screening for malignant neoplasm of prostate: Secondary | ICD-10-CM | POA: Diagnosis not present

## 2016-12-22 DIAGNOSIS — Z0000 Encounter for general adult medical examination without abnormal findings: Secondary | ICD-10-CM | POA: Diagnosis not present

## 2016-12-27 MED FILL — GABAPENTIN 300 MG CAPSULE: 300 | 90 days supply | Qty: 180 | Fill #0 | Status: TO

## 2017-01-02 DIAGNOSIS — Z1389 Encounter for screening for other disorder: Secondary | ICD-10-CM | POA: Diagnosis not present

## 2017-01-02 DIAGNOSIS — R05 Cough: Secondary | ICD-10-CM | POA: Diagnosis not present

## 2017-01-02 DIAGNOSIS — N200 Calculus of kidney: Secondary | ICD-10-CM | POA: Diagnosis not present

## 2017-01-02 DIAGNOSIS — Z0000 Encounter for general adult medical examination without abnormal findings: Secondary | ICD-10-CM | POA: Diagnosis not present

## 2017-01-02 DIAGNOSIS — M25511 Pain in right shoulder: Secondary | ICD-10-CM | POA: Diagnosis not present

## 2017-01-02 DIAGNOSIS — K429 Umbilical hernia without obstruction or gangrene: Secondary | ICD-10-CM | POA: Diagnosis not present

## 2017-01-03 DIAGNOSIS — Z1212 Encounter for screening for malignant neoplasm of rectum: Secondary | ICD-10-CM | POA: Diagnosis not present

## 2017-01-15 DIAGNOSIS — G4733 Obstructive sleep apnea (adult) (pediatric): Secondary | ICD-10-CM | POA: Diagnosis not present

## 2017-01-25 DIAGNOSIS — N200 Calculus of kidney: Secondary | ICD-10-CM | POA: Diagnosis not present

## 2017-01-25 DIAGNOSIS — R3914 Feeling of incomplete bladder emptying: Secondary | ICD-10-CM | POA: Diagnosis not present

## 2017-01-25 DIAGNOSIS — N401 Enlarged prostate with lower urinary tract symptoms: Secondary | ICD-10-CM | POA: Diagnosis not present

## 2017-04-27 DIAGNOSIS — G4733 Obstructive sleep apnea (adult) (pediatric): Secondary | ICD-10-CM | POA: Diagnosis not present

## 2017-05-10 IMAGING — RF DG ABDOMEN 1V
1 series · 1 of 1 positions shown · non-contrast
Comparison: None.

CLINICAL DATA: Retrograde pyelogram.

EXAM:
ABDOMEN - 1 VIEW

[Series 1: run · 1 of 1 slices shown]
[im 1/1]
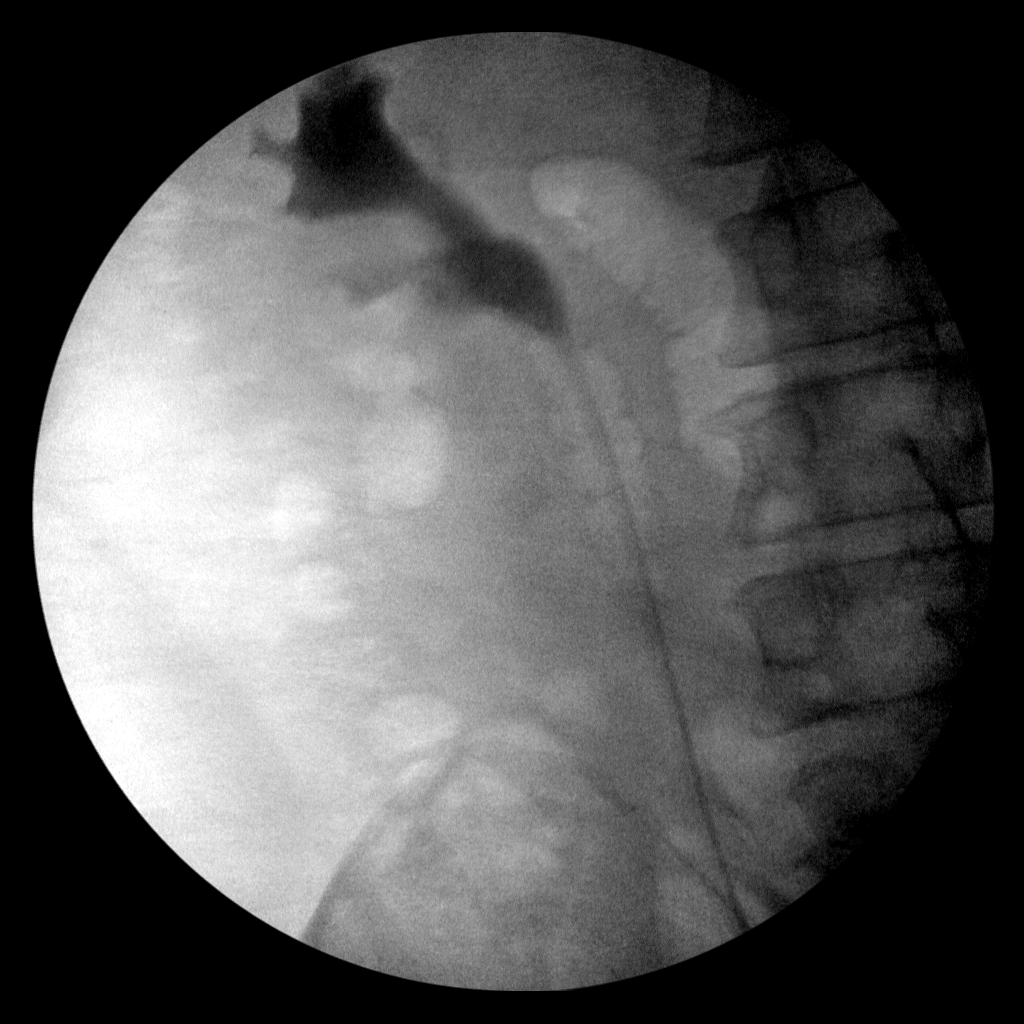

[1 of 1 positions shown; findings below may reference images not displayed]

FINDINGS: Single spot image from the operating room demonstrates a RIGHT
ureteral catheter with opacification of the RIGHT collecting system.
IMPRESSION: As above.

## 2017-05-11 DIAGNOSIS — Z6837 Body mass index (BMI) 37.0-37.9, adult: Secondary | ICD-10-CM | POA: Diagnosis not present

## 2017-05-11 DIAGNOSIS — R05 Cough: Secondary | ICD-10-CM | POA: Diagnosis not present

## 2017-05-11 DIAGNOSIS — J019 Acute sinusitis, unspecified: Secondary | ICD-10-CM | POA: Diagnosis not present

## 2017-07-13 DIAGNOSIS — J3089 Other allergic rhinitis: Secondary | ICD-10-CM | POA: Diagnosis not present

## 2017-07-13 DIAGNOSIS — G4733 Obstructive sleep apnea (adult) (pediatric): Secondary | ICD-10-CM | POA: Diagnosis not present

## 2017-07-13 DIAGNOSIS — Z9989 Dependence on other enabling machines and devices: Secondary | ICD-10-CM | POA: Diagnosis not present

## 2017-07-24 DIAGNOSIS — N401 Enlarged prostate with lower urinary tract symptoms: Secondary | ICD-10-CM | POA: Diagnosis not present

## 2017-07-24 DIAGNOSIS — R351 Nocturia: Secondary | ICD-10-CM | POA: Diagnosis not present

## 2017-07-24 DIAGNOSIS — N200 Calculus of kidney: Secondary | ICD-10-CM | POA: Diagnosis not present

## 2017-09-20 DIAGNOSIS — K621 Rectal polyp: Secondary | ICD-10-CM | POA: Diagnosis not present

## 2017-09-20 DIAGNOSIS — D124 Benign neoplasm of descending colon: Secondary | ICD-10-CM | POA: Diagnosis not present

## 2017-09-20 DIAGNOSIS — K2950 Unspecified chronic gastritis without bleeding: Secondary | ICD-10-CM | POA: Diagnosis not present

## 2017-11-03 DIAGNOSIS — Z23 Encounter for immunization: Secondary | ICD-10-CM | POA: Diagnosis not present

## 2017-12-06 DIAGNOSIS — H9193 Unspecified hearing loss, bilateral: Secondary | ICD-10-CM | POA: Diagnosis not present

## 2017-12-06 DIAGNOSIS — J019 Acute sinusitis, unspecified: Secondary | ICD-10-CM | POA: Diagnosis not present

## 2017-12-06 DIAGNOSIS — H6123 Impacted cerumen, bilateral: Secondary | ICD-10-CM | POA: Diagnosis not present

## 2017-12-06 DIAGNOSIS — R05 Cough: Secondary | ICD-10-CM | POA: Diagnosis not present

## 2017-12-11 DIAGNOSIS — H52223 Regular astigmatism, bilateral: Secondary | ICD-10-CM | POA: Diagnosis not present

## 2017-12-31 DIAGNOSIS — R82998 Other abnormal findings in urine: Secondary | ICD-10-CM | POA: Diagnosis not present

## 2017-12-31 DIAGNOSIS — R5383 Other fatigue: Secondary | ICD-10-CM | POA: Diagnosis not present

## 2017-12-31 DIAGNOSIS — Z125 Encounter for screening for malignant neoplasm of prostate: Secondary | ICD-10-CM | POA: Diagnosis not present

## 2017-12-31 DIAGNOSIS — Z0000 Encounter for general adult medical examination without abnormal findings: Secondary | ICD-10-CM | POA: Diagnosis not present

## 2018-01-07 DIAGNOSIS — Z1389 Encounter for screening for other disorder: Secondary | ICD-10-CM | POA: Diagnosis not present

## 2018-01-07 DIAGNOSIS — J019 Acute sinusitis, unspecified: Secondary | ICD-10-CM | POA: Diagnosis not present

## 2018-01-07 DIAGNOSIS — K219 Gastro-esophageal reflux disease without esophagitis: Secondary | ICD-10-CM | POA: Diagnosis not present

## 2018-01-07 DIAGNOSIS — G4709 Other insomnia: Secondary | ICD-10-CM | POA: Diagnosis not present

## 2018-01-07 DIAGNOSIS — Z0000 Encounter for general adult medical examination without abnormal findings: Secondary | ICD-10-CM | POA: Diagnosis not present

## 2018-01-11 DIAGNOSIS — Z1212 Encounter for screening for malignant neoplasm of rectum: Secondary | ICD-10-CM | POA: Diagnosis not present

## 2018-07-16 DIAGNOSIS — J3089 Other allergic rhinitis: Secondary | ICD-10-CM | POA: Diagnosis not present

## 2018-07-16 DIAGNOSIS — Z9989 Dependence on other enabling machines and devices: Secondary | ICD-10-CM | POA: Diagnosis not present

## 2018-07-16 DIAGNOSIS — G4733 Obstructive sleep apnea (adult) (pediatric): Secondary | ICD-10-CM | POA: Diagnosis not present

## 2018-09-10 ENCOUNTER — Encounter: Payer: Self-pay | Admitting: Gastroenterology

## 2018-10-25 DIAGNOSIS — R351 Nocturia: Secondary | ICD-10-CM | POA: Diagnosis not present

## 2018-10-25 DIAGNOSIS — N200 Calculus of kidney: Secondary | ICD-10-CM | POA: Diagnosis not present

## 2018-10-25 DIAGNOSIS — N401 Enlarged prostate with lower urinary tract symptoms: Secondary | ICD-10-CM | POA: Diagnosis not present

## 2018-12-13 DIAGNOSIS — H52223 Regular astigmatism, bilateral: Secondary | ICD-10-CM | POA: Diagnosis not present

## 2019-01-07 DIAGNOSIS — Z0000 Encounter for general adult medical examination without abnormal findings: Secondary | ICD-10-CM | POA: Diagnosis not present

## 2019-01-07 DIAGNOSIS — R5383 Other fatigue: Secondary | ICD-10-CM | POA: Diagnosis not present

## 2019-01-07 DIAGNOSIS — Z125 Encounter for screening for malignant neoplasm of prostate: Secondary | ICD-10-CM | POA: Diagnosis not present

## 2019-01-07 DIAGNOSIS — R7989 Other specified abnormal findings of blood chemistry: Secondary | ICD-10-CM | POA: Diagnosis not present

## 2019-01-10 DIAGNOSIS — Z1331 Encounter for screening for depression: Secondary | ICD-10-CM | POA: Diagnosis not present

## 2019-01-10 DIAGNOSIS — G4733 Obstructive sleep apnea (adult) (pediatric): Secondary | ICD-10-CM | POA: Diagnosis not present

## 2019-01-10 DIAGNOSIS — Z0000 Encounter for general adult medical examination without abnormal findings: Secondary | ICD-10-CM | POA: Diagnosis not present

## 2019-01-10 DIAGNOSIS — F5221 Male erectile disorder: Secondary | ICD-10-CM | POA: Diagnosis not present

## 2019-01-10 DIAGNOSIS — M19011 Primary osteoarthritis, right shoulder: Secondary | ICD-10-CM | POA: Diagnosis not present

## 2019-01-10 DIAGNOSIS — K219 Gastro-esophageal reflux disease without esophagitis: Secondary | ICD-10-CM | POA: Diagnosis not present

## 2019-01-16 DIAGNOSIS — J3089 Other allergic rhinitis: Secondary | ICD-10-CM | POA: Diagnosis not present

## 2019-01-16 DIAGNOSIS — G4733 Obstructive sleep apnea (adult) (pediatric): Secondary | ICD-10-CM | POA: Diagnosis not present

## 2019-01-16 DIAGNOSIS — Z9989 Dependence on other enabling machines and devices: Secondary | ICD-10-CM | POA: Diagnosis not present

## 2019-01-17 DIAGNOSIS — M5126 Other intervertebral disc displacement, lumbar region: Secondary | ICD-10-CM | POA: Diagnosis not present

## 2019-01-17 DIAGNOSIS — M48061 Spinal stenosis, lumbar region without neurogenic claudication: Secondary | ICD-10-CM | POA: Diagnosis not present

## 2019-01-28 DIAGNOSIS — M545 Low back pain: Secondary | ICD-10-CM | POA: Diagnosis not present

## 2019-01-28 DIAGNOSIS — M47816 Spondylosis without myelopathy or radiculopathy, lumbar region: Secondary | ICD-10-CM | POA: Diagnosis not present

## 2019-02-13 DIAGNOSIS — M545 Low back pain: Secondary | ICD-10-CM | POA: Diagnosis not present

## 2019-02-13 DIAGNOSIS — M2560 Stiffness of unspecified joint, not elsewhere classified: Secondary | ICD-10-CM | POA: Diagnosis not present

## 2019-02-13 DIAGNOSIS — R2689 Other abnormalities of gait and mobility: Secondary | ICD-10-CM | POA: Diagnosis not present

## 2019-07-09 DIAGNOSIS — M25562 Pain in left knee: Secondary | ICD-10-CM | POA: Diagnosis not present

## 2019-07-09 DIAGNOSIS — R5383 Other fatigue: Secondary | ICD-10-CM | POA: Diagnosis not present

## 2019-07-09 DIAGNOSIS — K219 Gastro-esophageal reflux disease without esophagitis: Secondary | ICD-10-CM | POA: Diagnosis not present

## 2019-07-09 DIAGNOSIS — R001 Bradycardia, unspecified: Secondary | ICD-10-CM | POA: Diagnosis not present

## 2019-07-23 DIAGNOSIS — M25561 Pain in right knee: Secondary | ICD-10-CM | POA: Diagnosis not present

## 2019-09-08 DIAGNOSIS — M25561 Pain in right knee: Secondary | ICD-10-CM | POA: Diagnosis not present

## 2019-10-20 DIAGNOSIS — M722 Plantar fascial fibromatosis: Secondary | ICD-10-CM | POA: Diagnosis not present

## 2019-10-20 DIAGNOSIS — M25561 Pain in right knee: Secondary | ICD-10-CM | POA: Diagnosis not present

## 2019-10-21 ENCOUNTER — Other Ambulatory Visit: Payer: Self-pay | Admitting: Urology

## 2019-10-23 DIAGNOSIS — M722 Plantar fascial fibromatosis: Secondary | ICD-10-CM | POA: Diagnosis not present

## 2019-10-23 DIAGNOSIS — M25571 Pain in right ankle and joints of right foot: Secondary | ICD-10-CM | POA: Diagnosis not present

## 2019-10-23 DIAGNOSIS — R2689 Other abnormalities of gait and mobility: Secondary | ICD-10-CM | POA: Diagnosis not present

## 2019-10-31 DIAGNOSIS — M722 Plantar fascial fibromatosis: Secondary | ICD-10-CM | POA: Diagnosis not present

## 2019-10-31 DIAGNOSIS — R2689 Other abnormalities of gait and mobility: Secondary | ICD-10-CM | POA: Diagnosis not present

## 2019-10-31 DIAGNOSIS — M25571 Pain in right ankle and joints of right foot: Secondary | ICD-10-CM | POA: Diagnosis not present

## 2019-12-01 DIAGNOSIS — M722 Plantar fascial fibromatosis: Secondary | ICD-10-CM | POA: Diagnosis not present

## 2019-12-01 DIAGNOSIS — M25561 Pain in right knee: Secondary | ICD-10-CM | POA: Diagnosis not present

## 2019-12-19 DIAGNOSIS — H52223 Regular astigmatism, bilateral: Secondary | ICD-10-CM | POA: Diagnosis not present

## 2020-01-12 DIAGNOSIS — M25561 Pain in right knee: Secondary | ICD-10-CM | POA: Diagnosis not present

## 2020-01-16 DIAGNOSIS — G4733 Obstructive sleep apnea (adult) (pediatric): Secondary | ICD-10-CM | POA: Diagnosis not present

## 2020-01-16 DIAGNOSIS — Z9989 Dependence on other enabling machines and devices: Secondary | ICD-10-CM | POA: Diagnosis not present

## 2020-01-16 DIAGNOSIS — J3089 Other allergic rhinitis: Secondary | ICD-10-CM | POA: Diagnosis not present

## 2020-01-20 DIAGNOSIS — R972 Elevated prostate specific antigen [PSA]: Secondary | ICD-10-CM | POA: Diagnosis not present

## 2020-01-20 DIAGNOSIS — Z8042 Family history of malignant neoplasm of prostate: Secondary | ICD-10-CM | POA: Diagnosis not present

## 2020-01-20 DIAGNOSIS — Z0000 Encounter for general adult medical examination without abnormal findings: Secondary | ICD-10-CM | POA: Diagnosis not present

## 2020-01-20 DIAGNOSIS — Z125 Encounter for screening for malignant neoplasm of prostate: Secondary | ICD-10-CM | POA: Diagnosis not present

## 2020-01-20 DIAGNOSIS — N400 Enlarged prostate without lower urinary tract symptoms: Secondary | ICD-10-CM | POA: Diagnosis not present

## 2020-01-27 DIAGNOSIS — Z0000 Encounter for general adult medical examination without abnormal findings: Secondary | ICD-10-CM | POA: Diagnosis not present

## 2020-01-27 DIAGNOSIS — Z23 Encounter for immunization: Secondary | ICD-10-CM | POA: Diagnosis not present

## 2020-01-27 DIAGNOSIS — R972 Elevated prostate specific antigen [PSA]: Secondary | ICD-10-CM | POA: Diagnosis not present

## 2020-02-02 DIAGNOSIS — N401 Enlarged prostate with lower urinary tract symptoms: Secondary | ICD-10-CM | POA: Diagnosis not present

## 2020-02-02 DIAGNOSIS — R972 Elevated prostate specific antigen [PSA]: Secondary | ICD-10-CM | POA: Diagnosis not present

## 2020-02-02 DIAGNOSIS — R351 Nocturia: Secondary | ICD-10-CM | POA: Diagnosis not present

## 2020-02-02 DIAGNOSIS — N200 Calculus of kidney: Secondary | ICD-10-CM | POA: Diagnosis not present

## 2020-02-11 DIAGNOSIS — Z01818 Encounter for other preprocedural examination: Secondary | ICD-10-CM | POA: Diagnosis not present

## 2020-02-11 DIAGNOSIS — M25561 Pain in right knee: Secondary | ICD-10-CM | POA: Diagnosis not present

## 2020-02-12 DIAGNOSIS — R972 Elevated prostate specific antigen [PSA]: Secondary | ICD-10-CM | POA: Diagnosis not present

## 2020-02-12 DIAGNOSIS — C61 Malignant neoplasm of prostate: Secondary | ICD-10-CM | POA: Diagnosis not present

## 2020-02-12 DIAGNOSIS — D075 Carcinoma in situ of prostate: Secondary | ICD-10-CM | POA: Diagnosis not present

## 2020-02-17 DIAGNOSIS — M25561 Pain in right knee: Secondary | ICD-10-CM | POA: Diagnosis not present

## 2020-02-19 DIAGNOSIS — C61 Malignant neoplasm of prostate: Secondary | ICD-10-CM | POA: Diagnosis not present

## 2020-02-19 DIAGNOSIS — R351 Nocturia: Secondary | ICD-10-CM | POA: Diagnosis not present

## 2020-02-19 DIAGNOSIS — N401 Enlarged prostate with lower urinary tract symptoms: Secondary | ICD-10-CM | POA: Diagnosis not present

## 2020-02-24 DIAGNOSIS — C61 Malignant neoplasm of prostate: Secondary | ICD-10-CM | POA: Insufficient documentation

## 2020-02-24 NOTE — Progress Notes (Signed)
Radiation Oncology         [phone] ________________________________  Initial outpatient Consultation  Name: Gregory [name] MRN: [id number]  Date: 02/25/2020  DOB: 05-18-1960  CC:Gregory Hatchet, MD  Gregory Fries, MD   REFERRING PHYSICIAN: Robley Fries, MD  DIAGNOSIS: 59 y.o. gentleman with Stage T1c adenocarcinoma of the prostate with Gleason score of 3+3, and PSA of 6.2.    ICD-10-CM   1. Malignant neoplasm of prostate ([place])  C61     HISTORY OF PRESENT ILLNESS: Gregory [name] is a 59 y.o. male with a diagnosis of prostate cancer. He has a history of BPH and nephrolithiasis, previously followed with Dr. Alyson [name].  More recently, he was noted to have an elevated PSA of 6.7 by his primary care physician, Dr. Ardeth [name]. A repeat PSA remained elevated at 6.2 so he was referred for evaluation in urology by Dr. Claudia [name] on 02/02/20,  digital rectal examination was performed at that time revealing no nodules or concerning findings.  The patient proceeded to transrectal ultrasound with 12 biopsies of the prostate on 02/12/20.  The prostate volume measured 42.9 cc.  Out of 12 core biopsies, 3 were positive.  The maximum Gleason score was 3+3, and this was seen in the left and right apex as well as the left apex lateral.  The patient reviewed the biopsy results with his urologist and he has kindly been referred today for discussion of potential radiation treatment options.  Of note, the patient has a strong family history of prostate cancer in his father and paternal grandfather.  His father is still living after undergoing prostatectomy.  His grandfather died of metastatic prostate cancer.   PREVIOUS RADIATION THERAPY: No  PAST MEDICAL HISTORY:  Past Medical History:  Diagnosis Date  . Arthritis   . Bradycardia   . Complication of anesthesia    bradycardia during a colonoscopy   . Frequency of urination   . GERD (gastroesophageal reflux disease)   . H/O seasonal  allergies   . Sleep apnea 2015   osa, cpap  . Umbilical hernia       PAST SURGICAL HISTORY: Past Surgical History:  Procedure Laterality Date  . CYSTOSCOPY W/ RETROGRADES Right 05/01/2016   Procedure: CYSTOSCOPY WITH RETROGRADE PYELOGRAM;  Surgeon: Cleon Gustin, MD;  Location: [facility];  Service: Urology;  Laterality: Right;  . CYSTOSCOPY/URETEROSCOPY/HOLMIUM LASER/STENT PLACEMENT Right 06/05/2016   Procedure: CYSTOSCOPY/RETROGRADE PYELOGRAM/URETEROSCOPY/HOLMIUM LASER/STENT EXCHANGE;  Surgeon: Cleon Gustin, MD;  Location: [facility];  Service: Urology;  Laterality: Right;  . LITHOTRIPSY     x2  . NEPHROLITHOTOMY Right 05/01/2016   Procedure: NEPHROLITHOTOMY RIGHT PERCUTANEOUS WITH PHYSICIAN'S ACCESS,STENT;  Surgeon: Cleon Gustin, MD;  Location: [facility];  Service: Urology;  Laterality: Right;  . UMBILICAL HERNIA REPAIR N/A 12/12/2016   Procedure: UMBILICAL HERNIA REPAIR;  Surgeon: Erroll Luna, MD;  Location: [facility];  Service: General;  Laterality: N/A;  . urinary stent   01/27/2016  . VASECTOMY      FAMILY HISTORY: No family history on file.  SOCIAL HISTORY:  Social History   Socioeconomic History  . Marital status: Married    Spouse name: Not on file  . Number of children: Not on file  . Years of education: Not on file  . Highest education level: Not on file  Occupational History  . Not on file  Tobacco Use  . Smoking status: Never Smoker  . Smokeless tobacco: Never Used  Substance and Sexual Activity  .  Alcohol use: No  . Drug use: No  . Sexual activity: Not on file  Other Topics Concern  . Not on file  Social History Narrative  . Not on file   Social Determinants of Health   Financial Resource Strain: Not on file  Food Insecurity: Not on file  Transportation Needs: Not on file  Physical Activity: Not on file  Stress: Not on file  Social Connections: Not on file  Intimate Partner Violence: Not on file    ALLERGIES: Tape and  Penicillins  MEDICATIONS:  Current Outpatient Medications  Medication Sig Dispense Refill  . acetaminophen (TYLENOL) 500 MG tablet Take 1,000 mg by mouth every 6 (six) hours as needed for mild pain (for pain.).     Marland Kitchen allopurinol (ZYLOPRIM) 300 MG tablet Take 1 tablet by mouth once daily 90 tablet 0  . cetirizine (ZYRTEC) 10 MG tablet Take 10 mg by mouth daily.    . cyclobenzaprine (FLEXERIL) 10 MG tablet Take 10 mg by mouth at bedtime as needed for muscle spasms.   5  . fluticasone (FLONASE) 50 MCG/ACT nasal spray Place 1 spray into the nose daily.     Marland Kitchen gabapentin (NEURONTIN) 300 MG capsule Take 300 mg by mouth at bedtime.  3  . Glucos-Chondroit-Hyaluron-D3 (TRIGOSAMINE MAX ST PO) Take 1 tablet by mouth at bedtime.     Marland Kitchen HYDROcodone-acetaminophen (NORCO) 7.5-325 MG tablet Take 1 tablet by mouth 2 (two) times daily.    Marland Kitchen ibuprofen (ADVIL,MOTRIN) 600 MG tablet Take 600 mg by mouth every 6 (six) hours as needed.    . montelukast (SINGULAIR) 10 MG tablet Take 10 mg by mouth at bedtime.     . Multiple Vitamins-Minerals (OCUVITE ADULT 50+ PO) Take 1 tablet by mouth at bedtime.    Marland Kitchen omeprazole (PRILOSEC) 20 MG capsule Take 20 mg by mouth 2 (two) times daily.  3  . tamsulosin (FLOMAX) 0.4 MG CAPS capsule Take 1 capsule (0.4 mg total) by mouth daily after supper. 90 capsule 3   No current facility-administered medications for this visit.    REVIEW OF SYSTEMS:  On review of systems, the patient reports that he is doing well overall. He denies any chest pain, shortness of breath, cough, fevers, chills, night sweats, unintended weight changes. He denies any bowel disturbances, and denies abdominal pain, nausea or vomiting. He denies any new musculoskeletal or joint aches or pains. His IPSS was 1, indicating minimal urinary symptoms. His SHIM was 20, indicating he has minimal erectile dysfunction. A complete review of systems is obtained and is otherwise negative.    PHYSICAL EXAM:  Wt Readings from  Last 3 Encounters:  12/11/16 260 lb (117.9 kg)  06/05/16 257 lb 3 oz (116.7 kg)  05/30/16 257 lb 3.2 oz (116.7 kg)   Temp Readings from Last 3 Encounters:  12/12/16 98 F (36.7 C)  06/05/16 97.4 F (36.3 C) (Oral)  05/30/16 97.9 F (36.6 C) (Oral)   BP Readings from Last 3 Encounters:  12/12/16 128/76  06/05/16 128/73  05/30/16 119/63   Pulse Readings from Last 3 Encounters:  12/12/16 73  06/05/16 68  05/30/16 (!) 55    /10  In general this is a well appearing Caucasian male in no acute distress. He's alert and oriented x4 and appropriate throughout the examination. Cardiopulmonary assessment is negative for acute distress, and he exhibits normal effort.     KPS = 100  100 - Normal; no complaints; no evidence of disease. 90   -  Able to carry on normal activity; minor signs or symptoms of disease. 80   - Normal activity with effort; some signs or symptoms of disease. 7   - Cares for self; unable to carry on normal activity or to do active work. 60   - Requires occasional assistance, but is able to care for most of his personal needs. 50   - Requires considerable assistance and frequent medical care. 40   - Disabled; requires special care and assistance. 30   - Severely disabled; hospital admission is indicated although death not imminent. 20   - Very sick; hospital admission necessary; active supportive treatment necessary. 10   - Moribund; fatal processes progressing rapidly. 0     - Dead  Karnofsky DA, Abelmann WH, Craver LS and Burchenal [facility] [phone]) The use of the nitrogen mustards in the palliative treatment of carcinoma: with particular reference to bronchogenic carcinoma Cancer 1 [id number]  LABORATORY DATA:  Lab Results  Component Value Date   WBC 5.6 05/30/2016   HGB 12.8 (L) 05/30/2016   HCT 37.9 (L) 05/30/2016   MCV 88.1 05/30/2016   PLT 191 05/30/2016   Lab Results  Component Value Date   NA 139 05/30/2016   K 3.8 05/30/2016   CL 106 05/30/2016   CO2 26  05/30/2016   No results found for: ALT, AST, GGT, ALKPHOS, BILITOT   RADIOGRAPHY: No results found.    IMPRESSION/PLAN: 1. 59 y.o. gentleman with Stage T1c adenocarcinoma of the prostate with Gleason Score of 3+3, and PSA of 6.2. We discussed the patient's workup and outlined the nature of prostate cancer in this setting. The patient's T stage, Gleason's score, and PSA put him into the low risk group. Accordingly, he is eligible for a variety of potential treatment options including brachytherapy, 5.5 weeks of external radiation or prostatectomy. We discussed the available radiation techniques, and focused on the details and logistics of delivery. We discussed and outlined the risks, benefits, short and long-term effects associated with radiotherapy and compared and contrasted these with prostatectomy. We discussed the role of SpaceOAR gel in reducing the rectal toxicity associated with radiotherapy.  He appears to have a good understanding of his disease and our treatment recommendations which are of curative intent.  He was encouraged to ask questions that were answered to his stated satisfaction.  At the conclusion of our conversation, the patient is interested in moving forward with active surveillance.  Prior to today's discussion, the patient and his family indicate that they had tremendous apprehension about active surveillance.  This initial bias was based on the prostate cancer cases experienced by the patient's father and grandfather.  In those cases, the prostate cancer was discovered at the time of symptoms rather than screening and those family members had much more advanced and complicated courses.  The patient's father actually had a MRSA infection following prostatectomy which colonized hardware in his pelvis which subsequently required removal in 1 year of dialysis.  I spent extra time today reassuring the patient that because his cancer was screening detected, that treatment should be  much more successful and effective with minimal complications.  However, I recommended that he wait with active surveillance until surgical or radiation intervention was truly indicated.  At this point, the patient is comfortable following up with Dr. Arita Miss and pursuing active surveillance.  He also understands that he can change his mind at any time if he would like to pursue treatment.   I personally spent 60 minutes in  this encounter including chart review, meeting face-to-face with the patient and completing documentation.  ------------------------------------------------   Tyler Pita, MD [facility] Director and Director of Stereotactic Radiosurgery Direct Dial: [phone]  Fax: [phone] [email or web address]  Skype  [email or web address]

## 2020-02-24 NOTE — Progress Notes (Signed)
GU Location of Tumor / Histology:  Adenocarcinoma of prostate  If Prostate Cancer, Gleason Score is (3 + 3; 02/12/2020) and PSA is (6.2; 01/20/2020) and Prostate volume (42.9 g)  Gregory [name] presented with signs/symptoms of:   Biopsies revealed:  02/12/2020   Past/Anticipated interventions by urology, if any:  02/12/2020 Dr. Kasandra Knudsen Transrectal ultrasound and prostate biopsy  Past/Anticipated interventions by medical oncology, if any:  No referral placed at this time  Weight changes, if any: denies  Bowel/Bladder complaints, if any: IPSS 1. SHIM 20. Denies dysuria, hematuria, urinary leakage or incontinence. Denies any bowel complaints.  Nausea/Vomiting, if any: denies  Pain issues, if any:  Constant right knee pain managed by a physician at [facility] with injections, PT, Aleve, and ibuprofen  SAFETY ISSUES:  Prior radiation? denies  Pacemaker/ICD? denies  Possible current pregnancy? N/A  Is the patient on methotrexate? denies  Current Complaints / other details:  59 year old male. Works as a Curator full time. Accompanied by wife and sister today. No children. Strong family history of cancer.

## 2020-02-25 ENCOUNTER — Encounter: Payer: Self-pay | Admitting: Radiation Oncology

## 2020-02-25 ENCOUNTER — Ambulatory Visit
Admission: RE | Admit: 2020-02-25 | Discharge: 2020-02-25 | Disposition: A | Discharge status: Home / Self Care | Payer: BC Managed Care – PPO | Source: Ambulatory Visit | Attending: Radiation Oncology | Admitting: Radiation Oncology

## 2020-02-25 ENCOUNTER — Other Ambulatory Visit: Payer: Self-pay

## 2020-02-25 DIAGNOSIS — N400 Enlarged prostate without lower urinary tract symptoms: Secondary | ICD-10-CM | POA: Insufficient documentation

## 2020-02-25 DIAGNOSIS — R001 Bradycardia, unspecified: Secondary | ICD-10-CM | POA: Diagnosis not present

## 2020-02-25 DIAGNOSIS — Z8042 Family history of malignant neoplasm of prostate: Secondary | ICD-10-CM | POA: Insufficient documentation

## 2020-02-25 DIAGNOSIS — M129 Arthropathy, unspecified: Secondary | ICD-10-CM | POA: Diagnosis not present

## 2020-02-25 DIAGNOSIS — Z87442 Personal history of urinary calculi: Secondary | ICD-10-CM | POA: Diagnosis not present

## 2020-02-25 DIAGNOSIS — R9721 Rising PSA following treatment for malignant neoplasm of prostate: Secondary | ICD-10-CM | POA: Insufficient documentation

## 2020-02-25 DIAGNOSIS — K219 Gastro-esophageal reflux disease without esophagitis: Secondary | ICD-10-CM | POA: Diagnosis not present

## 2020-02-25 DIAGNOSIS — K449 Diaphragmatic hernia without obstruction or gangrene: Secondary | ICD-10-CM | POA: Insufficient documentation

## 2020-02-25 DIAGNOSIS — G4730 Sleep apnea, unspecified: Secondary | ICD-10-CM | POA: Insufficient documentation

## 2020-02-25 DIAGNOSIS — C61 Malignant neoplasm of prostate: Secondary | ICD-10-CM

## 2020-02-25 DIAGNOSIS — Z79899 Other long term (current) drug therapy: Secondary | ICD-10-CM | POA: Diagnosis not present

## 2020-02-25 HISTORY — DX: Malignant neoplasm of prostate: C61

## 2020-02-26 ENCOUNTER — Encounter: Payer: Self-pay | Admitting: Licensed Clinical Social Worker

## 2020-02-26 NOTE — Progress Notes (Signed)
[facility] Psychosocial Distress Screening Clinical Social Work  Clinical Social Work was referred by distress screening protocol.  The patient scored a 5 on the Psychosocial Distress Thermometer which indicates moderate distress. Clinical Social Worker contacted patient by phone to assess for distress and other psychosocial needs.  After meeting with Dr. Kathrynn Running, patient will be doing active surveillance at this time. He declined any further information on support groups at this time.  ON[facility]N DISTRESS SCREENING 02/25/2020  Screening Type Initial Screening  Distress experienced in past week (1-10) 5  Practical problem type Work/school  Family Problem type Partner  Emotional problem type Nervousness/Anxiety;Adjusting to illness  Information Concerns Type Lack of info about treatment  Physical Problem type Pain  Physician notified of physical symptoms Yes  Referral to clinical psychology No  Referral to clinical social work No  Referral to dietition No  Referral to financial advocate No  Referral to support programs Yes  Referral to palliative care No    Clinical Social Worker follow up needed: No.  If yes, follow up plan:  Raun Routh E Loyalty Brashier, LCSW

## 2020-03-14 ENCOUNTER — Other Ambulatory Visit: Payer: Self-pay | Admitting: Urology

## 2020-03-22 DIAGNOSIS — Z20828 Contact with and (suspected) exposure to other viral communicable diseases: Secondary | ICD-10-CM | POA: Diagnosis not present

## 2020-03-22 DIAGNOSIS — R519 Headache, unspecified: Secondary | ICD-10-CM | POA: Diagnosis not present

## 2020-03-22 DIAGNOSIS — R051 Acute cough: Secondary | ICD-10-CM | POA: Diagnosis not present

## 2020-03-22 DIAGNOSIS — R062 Wheezing: Secondary | ICD-10-CM | POA: Diagnosis not present

## 2020-04-20 ENCOUNTER — Other Ambulatory Visit: Payer: Self-pay | Admitting: Urology

## 2020-04-26 ENCOUNTER — Other Ambulatory Visit: Payer: Self-pay | Admitting: Urology

## 2020-04-26 DIAGNOSIS — C61 Malignant neoplasm of prostate: Secondary | ICD-10-CM | POA: Diagnosis not present

## 2020-04-26 DIAGNOSIS — N401 Enlarged prostate with lower urinary tract symptoms: Secondary | ICD-10-CM | POA: Diagnosis not present

## 2020-04-26 DIAGNOSIS — N200 Calculus of kidney: Secondary | ICD-10-CM | POA: Diagnosis not present

## 2020-04-26 DIAGNOSIS — R350 Frequency of micturition: Secondary | ICD-10-CM | POA: Diagnosis not present

## 2020-05-20 ENCOUNTER — Ambulatory Visit
Admission: RE | Admit: 2020-05-20 | Discharge: 2020-05-20 | Disposition: A | Discharge status: Home / Self Care | Payer: BC Managed Care – PPO | Source: Ambulatory Visit | Attending: Urology | Admitting: Urology

## 2020-05-20 ENCOUNTER — Other Ambulatory Visit: Payer: Self-pay

## 2020-05-20 DIAGNOSIS — C61 Malignant neoplasm of prostate: Secondary | ICD-10-CM

## 2020-05-20 DIAGNOSIS — N400 Enlarged prostate without lower urinary tract symptoms: Secondary | ICD-10-CM | POA: Diagnosis not present

## 2020-05-20 DIAGNOSIS — R972 Elevated prostate specific antigen [PSA]: Secondary | ICD-10-CM | POA: Diagnosis not present

## 2020-05-20 DIAGNOSIS — R590 Localized enlarged lymph nodes: Secondary | ICD-10-CM | POA: Diagnosis not present

## 2020-05-20 MED ORDER — GADOBENATE DIMEGLUMINE 529 MG/ML IV SOLN
20.0000 mL | Freq: Once | INTRAVENOUS | Status: AC | PRN
  Administered 2020-05-20: 20 mL via INTRAVENOUS

## 2020-07-07 DIAGNOSIS — M25561 Pain in right knee: Secondary | ICD-10-CM | POA: Diagnosis not present

## 2020-07-14 DIAGNOSIS — G8918 Other acute postprocedural pain: Secondary | ICD-10-CM | POA: Diagnosis not present

## 2020-07-14 DIAGNOSIS — M659 Synovitis and tenosynovitis, unspecified: Secondary | ICD-10-CM | POA: Diagnosis not present

## 2020-07-14 DIAGNOSIS — Y999 Unspecified external cause status: Secondary | ICD-10-CM | POA: Diagnosis not present

## 2020-07-14 DIAGNOSIS — M94261 Chondromalacia, right knee: Secondary | ICD-10-CM | POA: Diagnosis not present

## 2020-07-14 DIAGNOSIS — M65861 Other synovitis and tenosynovitis, right lower leg: Secondary | ICD-10-CM | POA: Diagnosis not present

## 2020-07-14 DIAGNOSIS — X58XXXA Exposure to other specified factors, initial encounter: Secondary | ICD-10-CM | POA: Diagnosis not present

## 2020-07-14 DIAGNOSIS — S83231A Complex tear of medial meniscus, current injury, right knee, initial encounter: Secondary | ICD-10-CM | POA: Diagnosis not present

## 2020-07-14 DIAGNOSIS — M6751 Plica syndrome, right knee: Secondary | ICD-10-CM | POA: Diagnosis not present

## 2020-07-21 DIAGNOSIS — R531 Weakness: Secondary | ICD-10-CM | POA: Diagnosis not present

## 2020-07-21 DIAGNOSIS — M25561 Pain in right knee: Secondary | ICD-10-CM | POA: Diagnosis not present

## 2020-07-21 DIAGNOSIS — R262 Difficulty in walking, not elsewhere classified: Secondary | ICD-10-CM | POA: Diagnosis not present

## 2020-07-21 DIAGNOSIS — Z4789 Encounter for other orthopedic aftercare: Secondary | ICD-10-CM | POA: Diagnosis not present

## 2020-07-23 DIAGNOSIS — M25561 Pain in right knee: Secondary | ICD-10-CM | POA: Diagnosis not present

## 2020-07-23 DIAGNOSIS — R262 Difficulty in walking, not elsewhere classified: Secondary | ICD-10-CM | POA: Diagnosis not present

## 2020-07-23 DIAGNOSIS — R531 Weakness: Secondary | ICD-10-CM | POA: Diagnosis not present

## 2020-07-23 DIAGNOSIS — Z4789 Encounter for other orthopedic aftercare: Secondary | ICD-10-CM | POA: Diagnosis not present

## 2020-07-30 DIAGNOSIS — M25561 Pain in right knee: Secondary | ICD-10-CM | POA: Diagnosis not present

## 2020-07-30 DIAGNOSIS — R531 Weakness: Secondary | ICD-10-CM | POA: Diagnosis not present

## 2020-07-30 DIAGNOSIS — R262 Difficulty in walking, not elsewhere classified: Secondary | ICD-10-CM | POA: Diagnosis not present

## 2020-07-30 DIAGNOSIS — Z4789 Encounter for other orthopedic aftercare: Secondary | ICD-10-CM | POA: Diagnosis not present

## 2020-08-02 DIAGNOSIS — R531 Weakness: Secondary | ICD-10-CM | POA: Diagnosis not present

## 2020-08-02 DIAGNOSIS — M25561 Pain in right knee: Secondary | ICD-10-CM | POA: Diagnosis not present

## 2020-08-02 DIAGNOSIS — Z4789 Encounter for other orthopedic aftercare: Secondary | ICD-10-CM | POA: Diagnosis not present

## 2020-08-02 DIAGNOSIS — R262 Difficulty in walking, not elsewhere classified: Secondary | ICD-10-CM | POA: Diagnosis not present

## 2020-08-04 DIAGNOSIS — R531 Weakness: Secondary | ICD-10-CM | POA: Diagnosis not present

## 2020-08-04 DIAGNOSIS — R262 Difficulty in walking, not elsewhere classified: Secondary | ICD-10-CM | POA: Diagnosis not present

## 2020-08-04 DIAGNOSIS — Z4789 Encounter for other orthopedic aftercare: Secondary | ICD-10-CM | POA: Diagnosis not present

## 2020-08-04 DIAGNOSIS — M25561 Pain in right knee: Secondary | ICD-10-CM | POA: Diagnosis not present

## 2020-08-06 DIAGNOSIS — R531 Weakness: Secondary | ICD-10-CM | POA: Diagnosis not present

## 2020-08-06 DIAGNOSIS — M25561 Pain in right knee: Secondary | ICD-10-CM | POA: Diagnosis not present

## 2020-08-06 DIAGNOSIS — Z4789 Encounter for other orthopedic aftercare: Secondary | ICD-10-CM | POA: Diagnosis not present

## 2020-08-06 DIAGNOSIS — R262 Difficulty in walking, not elsewhere classified: Secondary | ICD-10-CM | POA: Diagnosis not present

## 2020-08-16 DIAGNOSIS — M25561 Pain in right knee: Secondary | ICD-10-CM | POA: Diagnosis not present

## 2020-08-16 DIAGNOSIS — R262 Difficulty in walking, not elsewhere classified: Secondary | ICD-10-CM | POA: Diagnosis not present

## 2020-08-16 DIAGNOSIS — R531 Weakness: Secondary | ICD-10-CM | POA: Diagnosis not present

## 2020-08-16 DIAGNOSIS — Z4789 Encounter for other orthopedic aftercare: Secondary | ICD-10-CM | POA: Diagnosis not present

## 2020-08-17 DIAGNOSIS — C61 Malignant neoplasm of prostate: Secondary | ICD-10-CM | POA: Diagnosis not present

## 2020-08-20 DIAGNOSIS — M25561 Pain in right knee: Secondary | ICD-10-CM | POA: Diagnosis not present

## 2020-08-20 DIAGNOSIS — R262 Difficulty in walking, not elsewhere classified: Secondary | ICD-10-CM | POA: Diagnosis not present

## 2020-08-20 DIAGNOSIS — R531 Weakness: Secondary | ICD-10-CM | POA: Diagnosis not present

## 2020-08-20 DIAGNOSIS — Z4789 Encounter for other orthopedic aftercare: Secondary | ICD-10-CM | POA: Diagnosis not present

## 2020-08-23 DIAGNOSIS — N401 Enlarged prostate with lower urinary tract symptoms: Secondary | ICD-10-CM | POA: Diagnosis not present

## 2020-08-23 DIAGNOSIS — M25561 Pain in right knee: Secondary | ICD-10-CM | POA: Diagnosis not present

## 2020-08-23 DIAGNOSIS — R262 Difficulty in walking, not elsewhere classified: Secondary | ICD-10-CM | POA: Diagnosis not present

## 2020-08-23 DIAGNOSIS — R350 Frequency of micturition: Secondary | ICD-10-CM | POA: Diagnosis not present

## 2020-08-23 DIAGNOSIS — C61 Malignant neoplasm of prostate: Secondary | ICD-10-CM | POA: Diagnosis not present

## 2020-08-23 DIAGNOSIS — N200 Calculus of kidney: Secondary | ICD-10-CM | POA: Diagnosis not present

## 2020-08-23 DIAGNOSIS — Z4789 Encounter for other orthopedic aftercare: Secondary | ICD-10-CM | POA: Diagnosis not present

## 2020-08-23 DIAGNOSIS — R531 Weakness: Secondary | ICD-10-CM | POA: Diagnosis not present

## 2020-08-27 DIAGNOSIS — R531 Weakness: Secondary | ICD-10-CM | POA: Diagnosis not present

## 2020-08-27 DIAGNOSIS — M25561 Pain in right knee: Secondary | ICD-10-CM | POA: Diagnosis not present

## 2020-08-27 DIAGNOSIS — Z4789 Encounter for other orthopedic aftercare: Secondary | ICD-10-CM | POA: Diagnosis not present

## 2020-08-27 DIAGNOSIS — R262 Difficulty in walking, not elsewhere classified: Secondary | ICD-10-CM | POA: Diagnosis not present

## 2020-08-31 DIAGNOSIS — R531 Weakness: Secondary | ICD-10-CM | POA: Diagnosis not present

## 2020-08-31 DIAGNOSIS — R262 Difficulty in walking, not elsewhere classified: Secondary | ICD-10-CM | POA: Diagnosis not present

## 2020-08-31 DIAGNOSIS — Z4789 Encounter for other orthopedic aftercare: Secondary | ICD-10-CM | POA: Diagnosis not present

## 2020-08-31 DIAGNOSIS — M25561 Pain in right knee: Secondary | ICD-10-CM | POA: Diagnosis not present

## 2020-09-03 DIAGNOSIS — R262 Difficulty in walking, not elsewhere classified: Secondary | ICD-10-CM | POA: Diagnosis not present

## 2020-09-03 DIAGNOSIS — Z4789 Encounter for other orthopedic aftercare: Secondary | ICD-10-CM | POA: Diagnosis not present

## 2020-09-03 DIAGNOSIS — M25561 Pain in right knee: Secondary | ICD-10-CM | POA: Diagnosis not present

## 2020-09-03 DIAGNOSIS — R531 Weakness: Secondary | ICD-10-CM | POA: Diagnosis not present

## 2020-09-09 DIAGNOSIS — Z4789 Encounter for other orthopedic aftercare: Secondary | ICD-10-CM | POA: Diagnosis not present

## 2020-09-09 DIAGNOSIS — R531 Weakness: Secondary | ICD-10-CM | POA: Diagnosis not present

## 2020-09-09 DIAGNOSIS — M25561 Pain in right knee: Secondary | ICD-10-CM | POA: Diagnosis not present

## 2020-09-09 DIAGNOSIS — R262 Difficulty in walking, not elsewhere classified: Secondary | ICD-10-CM | POA: Diagnosis not present

## 2020-09-15 DIAGNOSIS — R600 Localized edema: Secondary | ICD-10-CM | POA: Diagnosis not present

## 2020-09-15 DIAGNOSIS — M79604 Pain in right leg: Secondary | ICD-10-CM | POA: Diagnosis not present

## 2020-09-15 DIAGNOSIS — M79661 Pain in right lower leg: Secondary | ICD-10-CM | POA: Diagnosis not present

## 2020-09-21 DIAGNOSIS — R262 Difficulty in walking, not elsewhere classified: Secondary | ICD-10-CM | POA: Diagnosis not present

## 2020-09-21 DIAGNOSIS — M25561 Pain in right knee: Secondary | ICD-10-CM | POA: Diagnosis not present

## 2020-09-21 DIAGNOSIS — R531 Weakness: Secondary | ICD-10-CM | POA: Diagnosis not present

## 2020-09-21 DIAGNOSIS — Z4789 Encounter for other orthopedic aftercare: Secondary | ICD-10-CM | POA: Diagnosis not present

## 2020-09-23 DIAGNOSIS — R531 Weakness: Secondary | ICD-10-CM | POA: Diagnosis not present

## 2020-09-23 DIAGNOSIS — Z4789 Encounter for other orthopedic aftercare: Secondary | ICD-10-CM | POA: Diagnosis not present

## 2020-09-23 DIAGNOSIS — R262 Difficulty in walking, not elsewhere classified: Secondary | ICD-10-CM | POA: Diagnosis not present

## 2020-09-23 DIAGNOSIS — M25561 Pain in right knee: Secondary | ICD-10-CM | POA: Diagnosis not present

## 2020-09-28 DIAGNOSIS — M25561 Pain in right knee: Secondary | ICD-10-CM | POA: Diagnosis not present

## 2020-09-28 DIAGNOSIS — R531 Weakness: Secondary | ICD-10-CM | POA: Diagnosis not present

## 2020-09-28 DIAGNOSIS — Z4789 Encounter for other orthopedic aftercare: Secondary | ICD-10-CM | POA: Diagnosis not present

## 2020-09-28 DIAGNOSIS — R262 Difficulty in walking, not elsewhere classified: Secondary | ICD-10-CM | POA: Diagnosis not present

## 2020-09-30 DIAGNOSIS — R531 Weakness: Secondary | ICD-10-CM | POA: Diagnosis not present

## 2020-09-30 DIAGNOSIS — R262 Difficulty in walking, not elsewhere classified: Secondary | ICD-10-CM | POA: Diagnosis not present

## 2020-09-30 DIAGNOSIS — M25561 Pain in right knee: Secondary | ICD-10-CM | POA: Diagnosis not present

## 2020-09-30 DIAGNOSIS — Z4789 Encounter for other orthopedic aftercare: Secondary | ICD-10-CM | POA: Diagnosis not present

## 2020-10-04 DIAGNOSIS — Z4789 Encounter for other orthopedic aftercare: Secondary | ICD-10-CM | POA: Diagnosis not present

## 2020-10-04 DIAGNOSIS — M25561 Pain in right knee: Secondary | ICD-10-CM | POA: Diagnosis not present

## 2020-10-04 DIAGNOSIS — R262 Difficulty in walking, not elsewhere classified: Secondary | ICD-10-CM | POA: Diagnosis not present

## 2020-10-04 DIAGNOSIS — R531 Weakness: Secondary | ICD-10-CM | POA: Diagnosis not present

## 2020-10-07 DIAGNOSIS — R262 Difficulty in walking, not elsewhere classified: Secondary | ICD-10-CM | POA: Diagnosis not present

## 2020-10-07 DIAGNOSIS — M25561 Pain in right knee: Secondary | ICD-10-CM | POA: Diagnosis not present

## 2020-10-07 DIAGNOSIS — R531 Weakness: Secondary | ICD-10-CM | POA: Diagnosis not present

## 2020-10-07 DIAGNOSIS — Z4789 Encounter for other orthopedic aftercare: Secondary | ICD-10-CM | POA: Diagnosis not present

## 2020-10-11 DIAGNOSIS — M25561 Pain in right knee: Secondary | ICD-10-CM | POA: Diagnosis not present

## 2020-10-11 DIAGNOSIS — Z4789 Encounter for other orthopedic aftercare: Secondary | ICD-10-CM | POA: Diagnosis not present

## 2020-10-11 DIAGNOSIS — R262 Difficulty in walking, not elsewhere classified: Secondary | ICD-10-CM | POA: Diagnosis not present

## 2020-10-11 DIAGNOSIS — R531 Weakness: Secondary | ICD-10-CM | POA: Diagnosis not present

## 2020-10-18 DIAGNOSIS — M25561 Pain in right knee: Secondary | ICD-10-CM | POA: Diagnosis not present

## 2020-10-18 DIAGNOSIS — R262 Difficulty in walking, not elsewhere classified: Secondary | ICD-10-CM | POA: Diagnosis not present

## 2020-10-18 DIAGNOSIS — Z4789 Encounter for other orthopedic aftercare: Secondary | ICD-10-CM | POA: Diagnosis not present

## 2020-10-18 DIAGNOSIS — R531 Weakness: Secondary | ICD-10-CM | POA: Diagnosis not present

## 2020-12-14 DIAGNOSIS — M25561 Pain in right knee: Secondary | ICD-10-CM | POA: Diagnosis not present

## 2021-01-05 DIAGNOSIS — G4733 Obstructive sleep apnea (adult) (pediatric): Secondary | ICD-10-CM | POA: Diagnosis not present

## 2021-01-05 DIAGNOSIS — Z9989 Dependence on other enabling machines and devices: Secondary | ICD-10-CM | POA: Diagnosis not present

## 2021-01-05 DIAGNOSIS — J3089 Other allergic rhinitis: Secondary | ICD-10-CM | POA: Diagnosis not present

## 2021-01-19 DIAGNOSIS — C61 Malignant neoplasm of prostate: Secondary | ICD-10-CM | POA: Diagnosis not present

## 2021-01-28 DIAGNOSIS — Z125 Encounter for screening for malignant neoplasm of prostate: Secondary | ICD-10-CM | POA: Diagnosis not present

## 2021-01-28 DIAGNOSIS — R7989 Other specified abnormal findings of blood chemistry: Secondary | ICD-10-CM | POA: Diagnosis not present

## 2021-01-31 DIAGNOSIS — C61 Malignant neoplasm of prostate: Secondary | ICD-10-CM | POA: Diagnosis not present

## 2021-02-04 DIAGNOSIS — Z0000 Encounter for general adult medical examination without abnormal findings: Secondary | ICD-10-CM | POA: Diagnosis not present

## 2021-02-04 DIAGNOSIS — Z1339 Encounter for screening examination for other mental health and behavioral disorders: Secondary | ICD-10-CM | POA: Diagnosis not present

## 2021-02-04 DIAGNOSIS — Z1331 Encounter for screening for depression: Secondary | ICD-10-CM | POA: Diagnosis not present

## 2021-02-04 DIAGNOSIS — Z23 Encounter for immunization: Secondary | ICD-10-CM | POA: Diagnosis not present

## 2021-02-04 DIAGNOSIS — G894 Chronic pain syndrome: Secondary | ICD-10-CM | POA: Diagnosis not present

## 2021-02-04 DIAGNOSIS — G4700 Insomnia, unspecified: Secondary | ICD-10-CM | POA: Diagnosis not present

## 2021-02-14 DIAGNOSIS — R350 Frequency of micturition: Secondary | ICD-10-CM | POA: Diagnosis not present

## 2021-02-14 DIAGNOSIS — N401 Enlarged prostate with lower urinary tract symptoms: Secondary | ICD-10-CM | POA: Diagnosis not present

## 2021-02-14 DIAGNOSIS — C61 Malignant neoplasm of prostate: Secondary | ICD-10-CM | POA: Diagnosis not present

## 2021-02-23 DIAGNOSIS — Z20828 Contact with and (suspected) exposure to other viral communicable diseases: Secondary | ICD-10-CM | POA: Diagnosis not present

## 2021-02-23 DIAGNOSIS — B349 Viral infection, unspecified: Secondary | ICD-10-CM | POA: Diagnosis not present

## 2021-02-23 DIAGNOSIS — M791 Myalgia, unspecified site: Secondary | ICD-10-CM | POA: Diagnosis not present

## 2021-03-22 DIAGNOSIS — N401 Enlarged prostate with lower urinary tract symptoms: Secondary | ICD-10-CM | POA: Diagnosis not present

## 2021-03-22 DIAGNOSIS — C61 Malignant neoplasm of prostate: Secondary | ICD-10-CM | POA: Diagnosis not present

## 2021-03-22 DIAGNOSIS — R3911 Hesitancy of micturition: Secondary | ICD-10-CM | POA: Diagnosis not present

## 2021-04-24 IMAGING — MR MR PROSTATE WO/W CM
12 series · 48 of 48 positions shown · IV contrast (20 ml multihance)
Comparison: CT pelvis 05/04/2016

CLINICAL DATA: Elevated PSA level of 6.2 on 01/20/2019. Biopsy
02/12/2020 showed Gleason 3+3=6 adenocarcinoma involving small
volumes of the left lateral apex, left apex, and right apex.

EXAM:
MR PROSTATE WITHOUT AND WITH CONTRAST
TECHNIQUE: Multiplanar multisequence MRI images were obtained of the pelvis
centered about the prostate. Pre and post contrast images were
obtained.
CONTRAST:  20mL MULTIHANCE GADOBENATE DIMEGLUMINE 529 MG/ML IV SOLN

[Series 3: T2 · coronal · 3.0mm · 0.56mm/px · 1 of 23 slices shown (1 of 3)]
[im 1/23]
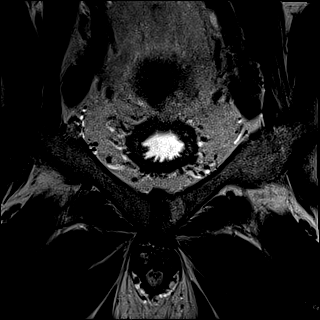

[Series 4: T1 · axial · 5.0mm · 1.25mm/px · 1 of 80 slices shown]
[im 1/80]
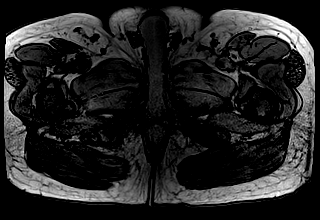

[Series 5: DWI · axial · 3.0mm · 1.75mm/px · z∈[-36,+33]mm · 2 of 69 slices shown (1 of 3)]
[im 1/69]
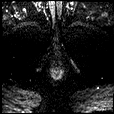
[im 69/69]
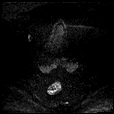

[Series 6: DWI · axial · 3.0mm · 1.75mm/px · 1 of 24 slices shown (2 of 3)]
[im 1/24]
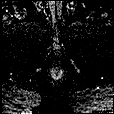

[Series 7: DWI · axial · 3.0mm · 1.75mm/px · 1 of 24 slices shown (3 of 3)]
[im 1/24]
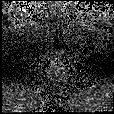

[Series 8: T2 · axial · 3.0mm · 0.56mm/px · 1 of 23 slices shown (2 of 3)]
[im 1/23]
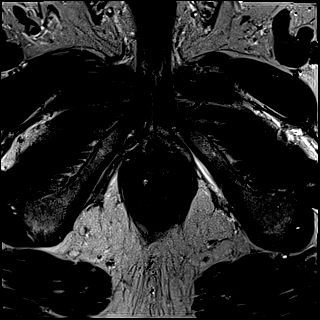

[Series 9: T2 · axial · 1.0mm · 1.04mm/px · z∈[-43,+36]mm · 2 of 80 slices shown (3 of 3)]
[im 1/80]
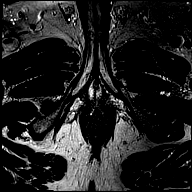
[im 80/80]
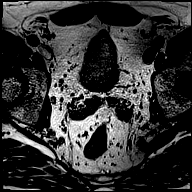

[Series 10: pre t1_twist_tra_dyn · axial · non-contrast · 3.5mm · 0.83mm/px · 1 of 20 slices shown]
[im 1/20]
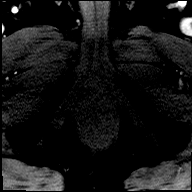

[Series 11: post t1_twist_tra_dyn-copy center · axial · non-contrast · 3.5mm · 0.83mm/px · z∈[-37,+29]mm · 17 of 600 slices shown]
[im 1/600]
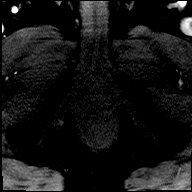
[im 38/600]
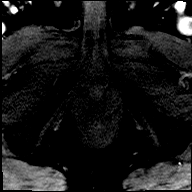
[im 75/600]
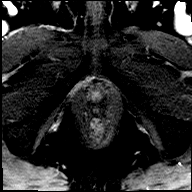
[im 113/600]
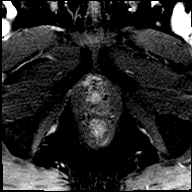
[im 150/600]
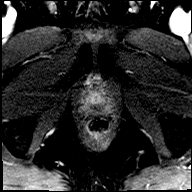
[im 188/600]
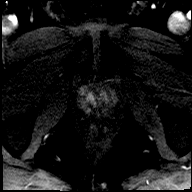
[im 225/600]
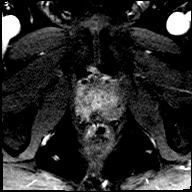
[im 263/600]
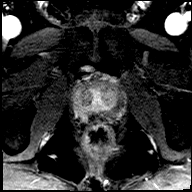
[im 300/600]
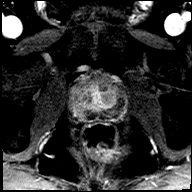
[im 337/600]
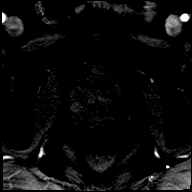
[im 375/600]
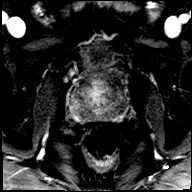
[im 412/600]
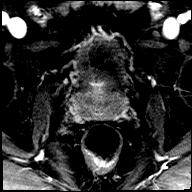
[im 450/600]
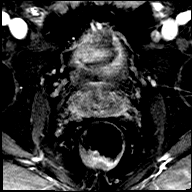
[im 487/600]
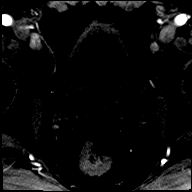
[im 525/600]
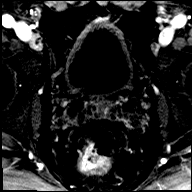
[im 562/600]
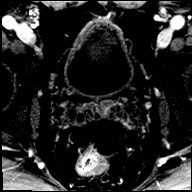
[im 600/600]
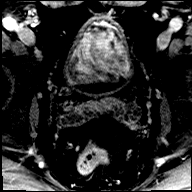

[Series 12: post t1_twist_tra_dyn-copy cent_sub · axial · 3.5mm · 0.83mm/px · z∈[-37,+29]mm · 17 of 580 slices shown]
[im 1/580]
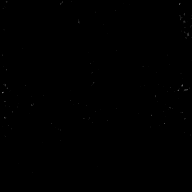
[im 37/580]
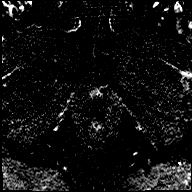
[im 73/580]
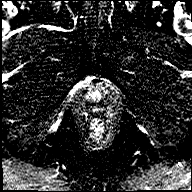
[im 109/580]
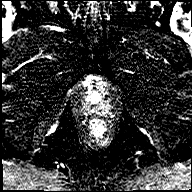
[im 145/580]
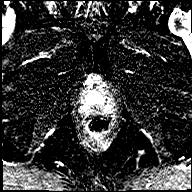
[im 181/580]
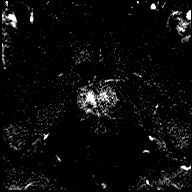
[im 218/580]
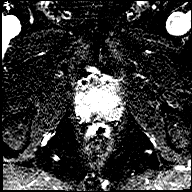
[im 254/580]
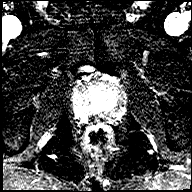
[im 290/580]
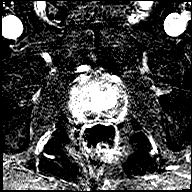
[im 326/580]
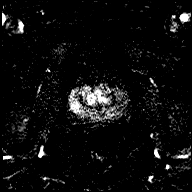
[im 362/580]
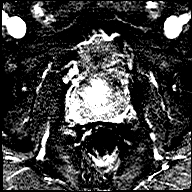
[im 399/580]
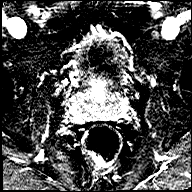
[im 435/580]
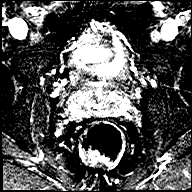
[im 471/580]
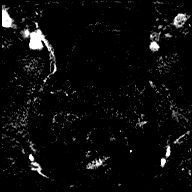
[im 507/580]
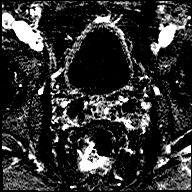
[im 543/580]
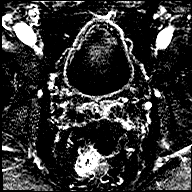
[im 580/580]
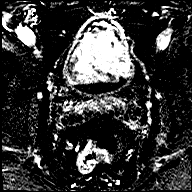

[Series 13: t1_vibe_dixon_tra_f · axial · 2.5mm · 0.91mm/px · z∈[-48,+149]mm · 2 of 80 slices shown]
[im 1/80]
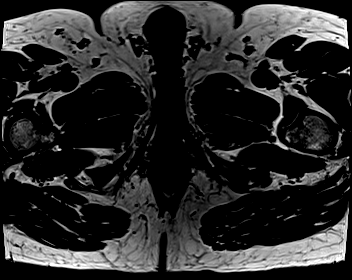
[im 80/80]
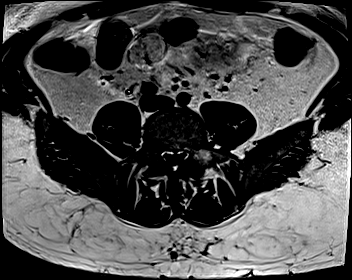

[Series 14: t1_vibe_dixon_tra_w · axial · 2.5mm · 0.91mm/px · z∈[-48,+149]mm · 2 of 80 slices shown]
[im 1/80]
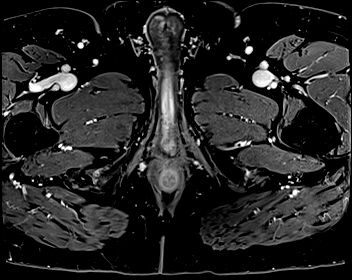
[im 80/80]
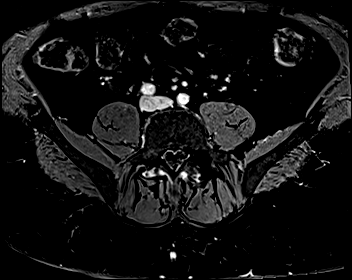

[48 of 48 positions shown; findings below may reference images not displayed]

FINDINGS: Prostate:

Region of interest-tech 1: PI-RADS category 3 lesion of the right
anterior and right posterolateral peripheral zone in the mid gland,
and right anterior, posterolateral, posteromedial peripheral zone at
the apex, with reduced T2 signal but no overt focal early
enhancement or restriction of diffusion. This measures 1.58 cubic cm
(1.6 by 0.5 by 2.4 cm) and is shown for example on image 13 series
8.

Generalized mild low T2 signal stranding in the peripheral zone is
likely postinflammatory, and is considered PI-RADS category 2.

Gas fluid nodularity in the transition zone compatible with
prostatic hypertrophy.

Volume: 3D volumetric assessment: Prostate volume 40.23 cubic cm
(5.0 by 4.0 by 4.4 cm).

Transcapsular spread:  Absent

Seminal vesicle involvement: Absent

Neurovascular bundle involvement: Absent

Pelvic adenopathy: Absent

Bone metastasis: Absent

Other findings: No supplemental non-categorized findings.
IMPRESSION: 1. PI-RADS category 3 lesion in the right peripheral zone measuring
1.58 cubic cm. Targeting data sent to [facility].
2. Mild benign prostatic hypertrophy. Prostate volume 40.23 cubic
cm.

## 2021-05-10 ENCOUNTER — Other Ambulatory Visit: Payer: Self-pay | Admitting: Urology

## 2021-05-12 DIAGNOSIS — M6281 Muscle weakness (generalized): Secondary | ICD-10-CM | POA: Diagnosis not present

## 2021-05-12 DIAGNOSIS — R350 Frequency of micturition: Secondary | ICD-10-CM | POA: Diagnosis not present

## 2021-05-31 DIAGNOSIS — C61 Malignant neoplasm of prostate: Secondary | ICD-10-CM | POA: Diagnosis not present

## 2021-06-02 NOTE — Patient Instructions (Addendum)
DUE TO COVID-19 ONLY ONE VISITOR  (aged 59 and older)  IS ALLOWED TO COME WITH YOU AND STAY IN THE WAITING ROOM ONLY DURING PRE OP AND PROCEDURE.   ?**NO VISITORS ARE ALLOWED IN THE SHORT STAY AREA OR RECOVERY ROOM!!** ? ?IF YOU WILL BE ADMITTED INTO THE HOSPITAL YOU ARE ALLOWED ONLY TWO SUPPORT PEOPLE DURING VISITATION HOURS ONLY (7 AM -8PM)   ?The support person(s) must pass our screening, gel in and out, and wear a mask at all times, including in the patient?s room. ?Patients must also wear a mask when staff or their support person are in the room. ?Visitors GUEST BADGE MUST BE WORN VISIBLY  ?One adult visitor may remain with you overnight and MUST be in the room by 8 P.M. ?  ? Your procedure is scheduled on: 06/16/21 ? ? Report to [facility] Main Entrance ? ?  Report to admitting at 5:15 AM ? ? Call this number if you have problems the morning of surgery [phone] ? ? Follow clear liquid diet the day before surgery. ? ? Do not eat or drink after midnight ? ?Water ?Black Coffee (sugar ok, NO MILK/CREAM OR CREAMERS)  ?Tea (sugar ok, NO MILK/CREAM OR CREAMERS) regular and decaf                             ?Plain Jell-O (NO RED)                                           ?Fruit ices (not with fruit pulp, NO RED)                                     ?Popsicles (NO RED)                                                                  ?Juice: apple, WHITE grape, WHITE cranberry ?Sports drinks like Gatorade (NO RED) ?Clear broth(vegetable,chicken,beef) ? ?            FOLLOW BOWEL PREP AND ANY ADDITIONAL PRE OP INSTRUCTIONS YOU RECEIVED FROM YOUR SURGEON'S OFFICE!!! ?  ?  ?Oral Hygiene is also important to reduce your risk of infection.                                    ?Remember - BRUSH YOUR TEETH THE MORNING OF SURGERY WITH YOUR REGULAR TOOTHPASTE ? ? Do NOT smoke after Midnight ? ? Take these medicines the morning of surgery with A SIP OF WATER: Tylenol, Zyrtec, Nexium, Flomax ? ?Bring CPAP mask and tubing  day of surgery. ?                  ?           You may not have any metal on your body including jewelry, and body piercing ? ?           Do not wear lotions, powders,  cologne, or deodorant ? ?            Men may shave face and neck. ? ? Do not bring valuables to the hospital. [place]. ? ? Contacts, dentures or bridgework may not be worn into surgery. ? ? Bring small overnight bag day of surgery ?             ?Please read over the following fact sheets you were given: IF [place]  ? ?   [facility] - Preparing for Surgery ?Before surgery, you can play an important role.  Because skin is not sterile, your skin needs to be as free of germs as possible.  You can reduce the number of germs on your skin by washing with CHG (chlorahexidine gluconate) soap before surgery.  CHG is an antiseptic cleaner which kills germs and bonds with the skin to continue killing germs even after washing. ?Please DO NOT use if you have an allergy to CHG or antibacterial soaps.  If your skin becomes reddened/irritated stop using the CHG and inform your nurse when you arrive at Short Stay. ?Do not shave (including legs and underarms) for at least 48 hours prior to the first CHG shower.  You may shave your face/neck. ? ?Please follow these instructions carefully: ? 1.  Shower with CHG Soap the night before surgery and the  morning of surgery. ? 2.  If you choose to wash your hair, wash your hair first as usual with your normal  shampoo. ? 3.  After you shampoo, rinse your hair and body thoroughly to remove the shampoo.                            ? 4.  Use CHG as you would any other liquid soap.  You can apply chg directly to the skin and wash.  Gently with a scrungie or clean washcloth. ? 5.  Apply the CHG Soap to your body ONLY FROM THE NECK DOWN.   Do   not use on face/ open      ?                     Wound or open sores. Avoid  contact with eyes, ears mouth and   genitals (private parts).  ?                     Production manager,  Genitals (private parts) with your normal soap. ?            6.  Wash thoroughly, paying special attention to the area where your    surgery  will be performed. ? 7.  Thoroughly rinse your body with warm water from the neck down. ? 8.  DO NOT shower/wash with your normal soap after using and rinsing off the CHG Soap. ?               9.  Pat yourself dry with a clean towel. ?           10.  Wear clean pajamas. ?           11.  Place clean sheets on your bed the night of your first shower and do not  sleep with pets. ?Day of Surgery : ?Do not apply any lotions/deodorants the morning of surgery.  Please  wear clean clothes to the hospital/surgery center. ? ?FAILURE TO FOLLOW THESE INSTRUCTIONS MAY RESULT IN THE CANCELLATION OF YOUR SURGERY ? ?PATIENT SIGNATURE_________________________________ ? ?NURSE SIGNATURE__________________________________ ? ?________________________________________________________________________  ? ?Incentive Spirometer ? ?An incentive spirometer is a tool that can help keep your lungs clear and active. This tool measures how well you are filling your lungs with each breath. Taking long deep breaths may help reverse or decrease the chance of developing breathing (pulmonary) problems (especially infection) following: ?A long period of time when you are unable to move or be active. ?BEFORE THE PROCEDURE  ?If the spirometer includes an indicator to show your best effort, your nurse or respiratory therapist will set it to a desired goal. ?If possible, sit up straight or lean slightly forward. Try not to slouch. ?Hold the incentive spirometer in an upright position. ?INSTRUCTIONS FOR USE  ?Sit on the edge of your bed if possible, or sit up as far as you can in bed or on a chair. ?Hold the incentive spirometer in an upright position. ?Breathe out normally. ?Place the mouthpiece in your mouth and seal your  lips tightly around it. ?Breathe in slowly and as deeply as possible, raising the piston or the ball toward the top of the column. ?Hold your breath for 3-5 seconds or for as long as possible. Allow the piston or ball to fall to the bottom of the column. ?Remove the mouthpiece from your mouth and breathe out normally. ?Rest for a few seconds and repeat Steps 1 through 7 at least 10 times every 1-2 hours when you are awake. Take your time and take a few normal breaths between deep breaths. ?The spirometer may include an indicator to show your best effort. Use the indicator as a goal to work toward during each repetition. ?After each set of 10 deep breaths, practice coughing to be sure your lungs are clear. If you have an incision (the cut made at the time of surgery), support your incision when coughing by placing a pillow or rolled up towels firmly against it. ?Once you are able to get out of bed, walk around indoors and cough well. You may stop using the incentive spirometer when instructed by your caregiver.  ?RISKS AND COMPLICATIONS ?Take your time so you do not get dizzy or light-headed. ?If you are in pain, you may need to take or ask for pain medication before doing incentive spirometry. It is harder to take a deep breath if you are having pain. ?AFTER USE ?Rest and breathe slowly and easily. ?It can be helpful to keep track of a log of your progress. Your caregiver can provide you with a simple table to help with this. ?If you are using the spirometer at home, follow these instructions: ?SEEK MEDICAL CARE IF:  ?You are having difficultly using the spirometer. ?You have trouble using the spirometer as often as instructed. ?Your pain medication is not giving enough relief while using the spirometer. ?You develop fever of 100.5? F (38.1? C) or higher. ?SEEK IMMEDIATE MEDICAL CARE IF:  ?You cough up bloody sputum that had not been present before. ?You develop fever of 102? F (38.9? C) or greater. ?You develop  worsening pain at or near the incision site. ?MAKE SURE YOU:  ?Understand these instructions. ?Will watch your condition. ?Will get help right away if you are not doing well or get worse. ?Document Released: 0

## 2021-06-02 NOTE — Progress Notes (Signed)
COVID Vaccine Completed: ?Date COVID Vaccine completed: ?Has received booster: ?COVID vaccine manufacturer: [place]  ? ?Date of COVID positive in last 90 days: ? ?PCP - Velna Hatchet, MD ?Cardiologist -  ? ?Chest x-ray -  ?EKG -  ?Stress Test -  ?ECHO -  ?Cardiac Cath -  ?Pacemaker/ICD device last checked: ?Spinal Cord Stimulator: ? ?Bowel Prep -  ? ?Sleep Study -  ?CPAP -  ? ?Fasting Blood Sugar -  ?Checks Blood Sugar _____ times a day ? ?Blood Thinner Instructions: ?Aspirin Instructions: ?Last Dose: ? ?Activity level:  Can go up a flight of stairs and perform activities of daily living without stopping and without symptoms of chest pain or shortness of breath. ?  Able to exercise without symptoms ? ?Unable to go up a flight of stairs without symptoms of  ?   ? ?Anesthesia review:  ? ?Patient denies shortness of breath, fever, cough and chest pain at PAT appointment ? ? ?Patient verbalized understanding of instructions that were given to them at the PAT appointment. Patient was also instructed that they will need to review over the PAT instructions again at home before surgery.  ?

## 2021-06-06 ENCOUNTER — Encounter (HOSPITAL_COMMUNITY)
Admission: RE | Admit: 2021-06-06 | Discharge: 2021-06-06 | Disposition: A | Discharge status: Home / Self Care | Payer: BC Managed Care – PPO | Source: Ambulatory Visit | Attending: Urology | Admitting: Urology

## 2021-06-06 ENCOUNTER — Other Ambulatory Visit: Payer: Self-pay

## 2021-06-06 ENCOUNTER — Encounter (HOSPITAL_COMMUNITY): Payer: Self-pay

## 2021-06-06 DIAGNOSIS — Z01812 Encounter for preprocedural laboratory examination: Secondary | ICD-10-CM | POA: Diagnosis not present

## 2021-06-06 DIAGNOSIS — N393 Stress incontinence (female) (male): Secondary | ICD-10-CM | POA: Diagnosis not present

## 2021-06-06 DIAGNOSIS — M62838 Other muscle spasm: Secondary | ICD-10-CM | POA: Diagnosis not present

## 2021-06-06 DIAGNOSIS — M6281 Muscle weakness (generalized): Secondary | ICD-10-CM | POA: Diagnosis not present

## 2021-06-06 HISTORY — DX: Nausea with vomiting, unspecified: R11.2

## 2021-06-06 HISTORY — DX: Headache, unspecified: R51.9

## 2021-06-06 HISTORY — DX: Family history of other specified conditions: Z84.89

## 2021-06-06 HISTORY — DX: Personal history of urinary calculi: Z87.442

## 2021-06-06 HISTORY — DX: Other specified postprocedural states: Z98.890

## 2021-06-06 LAB — BASIC METABOLIC PANEL
Anion gap: 5 (ref 5–15)
BUN: 12 mg/dL (ref 6–20)
CO2: 27 mmol/L (ref 22–32)
Calcium: 9.1 mg/dL (ref 8.9–10.3)
Chloride: 107 mmol/L (ref 98–111)
Creatinine, Ser: 0.9 mg/dL (ref 0.61–1.24)
GFR, Estimated: >60 mL/min (ref >60)
Glucose, Bld: 102 mg/dL — ABNORMAL HIGH (ref 70–99)
Potassium: 4.1 mmol/L (ref 3.5–5.1)
Sodium: 139 mmol/L (ref 135–145)

## 2021-06-06 LAB — CBC
HCT: 47.5 % (ref 39.0–52.0)
Hemoglobin: 15.6 g/dL (ref 13.0–17.0)
MCH: 31.3 pg (ref 26.0–34.0)
MCHC: 32.8 g/dL (ref 30.0–36.0)
MCV: 95.4 fL (ref 80.0–100.0)
Platelets: 199 10*3/uL (ref 150–400)
RBC: 4.98 MIL/uL (ref 4.22–5.81)
RDW: 13.4 % (ref 11.5–15.5)
WBC: 6.5 10*3/uL (ref 4.0–10.5)
nRBC: 0 % (ref 0.0–0.2)

## 2021-06-06 NOTE — Progress Notes (Addendum)
COVID Vaccine Completed:  Yes ?Date COVID Vaccine completed:  05-09-19 06-04-19 ?Has received booster: 03-17-20 ?COVID vaccine manufacturer: [employer]  ?  ?Date of COVID positive in last 90 days:  No ?  ?PCP - Velna Hatchet, MD ?Cardiologist - Saw Dr. Agustin Cree several years ago to eval bradycardia but no follow up needed, testing negative ?  ?Chest x-ray - N/A ?EKG - N/A ?Stress Test - N/A ?ECHO - greater than 5 years ago ?Cardiac Cath - N/A ?Pacemaker/ICD device last checked: ?Spinal Cord Stimulator: ?  ?Bowel Prep - Miralax and Fleet enema ?  ?Sleep Study - Yes,+sleep apnea ?CPAP - Yes ?  ?Fasting Blood Sugar -   N/A ?Checks Blood Sugar _____ times a day ?  ?Blood Thinner Instructions:  N/A ?Aspirin Instructions: ?Last Dose: ?  ?Activity level:   Can go up a flight of stairs and perform activities of daily living without stopping and without symptoms of chest pain or shortness of breath. ? ?Anesthesia review: N/A ?  ?Patient denies shortness of breath, fever, cough and chest pain at PAT appointment ?  ?  ?Patient verbalized understanding of instructions that were given to them at the PAT appointment. Patient was also instructed that they will need to review over the PAT instructions again at home before surgery.  ?

## 2021-06-15 NOTE — H&P (Signed)
? ? ?Office Visit Report     05/31/2021  ? ?-------------------------------------------------------------------------------- ?  ?Gregory [name]  ?MRN: [id number]  ?DOB: May 21, 1960, 61 year old Male  ?SSN: [id number]  ? PRIMARY CARE:  Velna Hatchet, MD  ?REFERRINGSimone Curia D. Claudia Desanctis, MD  ?PROVIDER:  Jacalyn Lefevre, M.D.  ?TREATING:  Mcarthur Rossetti, PA  ?LOCATION:  [facility] [phone]  ?  ? ?-------------------------------------------------------------------------------- ?  ?CC/HPI: Pt presents today for pre-operative history and physical exam in anticipation of robotic assisted lap radical prostatectomy with bilateral pelvic lymph node dissection by Dr. Alinda Money on 06/16/21. He is doing well and is without complaint.  ? ?Pt denies F/C, HA, CP, SOB, N/V, diarrhea/constipation, back pain, flank pain, hematuria, and dysuria.  ? ? ?HX:  ? ?CC: Prostate Cancer  ? ?Physician requesting consult: Dr. Jacalyn Lefevre  ?PCP: Dr. Velna Hatchet  ? ?Mr. Gregory [name] is a 61 year old gentleman who was initially diagnosed with low risk prostate cancer in December 2021 when his PSA was elevated at 6.7. His biopsy on 02/12/20 demonstrated 3 out of 12 biopsy cores positive for Gleason 3+3=6 adenocarcinoma. His PSA subsequently decreased and was most recently 1.63. An MRI of the prostate was performed on 05/20/20 that demonstrated a 1.6 cm PI-RADS 3 lesion at the right lateral mid gland but no other concerning findings. An MR/US fusion biopsy was performed on 01/31/21 and indicated Gleason 4+3=7 upgraded disease in 1 out of 12 systematic biopsy cores. All 4 MR targeted lesions were negative.  ? ?Family history: Father treated with RP, Grandfather died of metastatic prostate cancer.  ? ?Imaging studies: MRI (05/20/20): No EPE, SVI, LAD, and bone lesions.  ? ?PMH: He has a history of GERD, hyperlipidemia, asthma, sleep apnea, and urolithiasis (on allopurinol). He was noted a few years ago to have an episode of  bradycardia during a colonoscopy. He did undergo a full evaluation by cardiology and was seen by Dr. Nehemiah Massed he did not find any abnormalities. He has not had recent chest pain, shortness of breath, or palpitations.  ?PSH: Umbilical hernia repair without mesh.  ? ?TNM stage: cT1c N0 Mx  ?PSA: 1.63  ?Gleason score: 4+3=7 ( GG 3)  ?Biopsy (01/31/21): 1/16 cores positive  ?Left: Benign  ?Right: R apex (50%, 4+3=7)  ?MR targeted: Benign  ?Prostate volume: 46.0 cc  ? ?Nomogram  ?OC disease: 83%  ?EPE: 15%  ?SVI: 1%  ?LNI: 2%  ?PFS (5 year, 10 year): 90%, 83%  ? ?Urinary function: IPSS is 4. He is on tamsulosin 0.8 mg. He had been recently increased to 0.8 mg and has found this to be significantly more helpful particularly related to intermittency and hesitancy.  ?Erectile function: SHIM score is 15. He estimates that he can obtain erections adequate for intercourse approximately 50% of the time. He has not yet tried PDE 5 inhibitors.  ? ?  ?ALLERGIES: Adhesive Tape ?Penicillin - Hives ?  ?  Notes: Latex sensitivity   ? ?MEDICATIONS: Aleve  ?Apple Cider Vinegar  ?Astelin  ?Claritin  ?Elderberry Immune Health  ?Esomeprazole Magnesium  ?Flonase Allergy Relief  ?Mucinex Dm  ?Multiple Vitamin  ?Multivitamin  ?Neurontin 300 mg capsule  ?Rosuvastatin Calcium  ?Singulair 10 mg tablet  ?Super B Complex  ?Tums  ?Turmeric  ?Tylenol Extra Strength  ?Vitamin B12  ?  ? ?GU PSH: Cysto Remove Stent FB Sim - 2018 ?Percut Stone Removal >2cm, Right - 2018 ?Prostate Needle Biopsy - 01/31/2021, 02/12/2020 ?Ureteroscopic laser litho, Right -  2018 ?Vasectomy - 2001 ? ?  ?   ?PSH Notes: Wisdom teeth removal  ? ?Umbilical hernia repair  ? ?Right knee meniscal repair 2022  ?  ? ?NON-GU PSH: Surgical Pathology, Gross And Microscopic Examination For Prostate Needle - 01/31/2021, 02/12/2020 ? ?  ? ?GU PMH: Prostate Cancer - 05/12/2021, - 03/22/2021, - 02/14/2021, - 01/31/2021, - 08/23/2020, - 04/26/2020, - 02/19/2020 ?Urinary Frequency - 05/12/2021, -  02/14/2021, - 08/23/2020, - 04/26/2020 ?BPH w/LUTS - 03/22/2021, - 02/14/2021, - 08/23/2020, - 04/26/2020, continue flomax, - 02/19/2020, - 02/02/2020, - 2020, - 2019, - 2018, - 2018 ?Urinary Hesitancy - 03/22/2021 ?Renal calculus - 08/23/2020, - 04/26/2020, - 02/02/2020, - 2020, - 2019, - 2018, - 2018, - 2018, - 2018, - 2018, - 2018 ?Elevated PSA - 02/12/2020, Discussed possible reasons for elevated PSA including recent infection, trauma, inflammation, indwelling catheter, enlarged prostate and prostate cancer. I would like to schedule transrectal ultrasound guided prostate biopsy for patient. I discussed risks and benefits of prostate biospy including blood in the urine/stool/semen, pain, and risk of post biopsy sepsis. He was given instructions regarding the prostate biospy. He will take 1 tab of levaquin '750mg'$  PO the morning of his prostate biopsy. Patient understands and agrees with the above. , - 02/02/2020 ?Renal cyst, Left, Parapelvic cyst ?  ?   ?PMH Notes: asthma  ? ?HLD  ? ?NON-GU PMH: Muscle weakness (generalized) - 05/12/2021 ?Arthritis ?GERD ?Sleep Apnea ?  ? ?FAMILY HISTORY: Acute CVA (cerebrovascular accident) - Grandmother ?Diabetes - Grandmother ?Kidney Failure - Father ?nephrolithiasis - Mother ?Prostate Cancer - Father  ? ?SOCIAL HISTORY: Marital Status: Married ?Preferred Language: [place]; Ethnicity: Not Hispanic Or Latino; Race: White ?Current Smoking Status: Patient has never smoked.  ? ?Tobacco Use Assessment Completed: Used Tobacco in last 30 days? ?Does not use smokeless tobacco. ?Does drink.  ?Does not use drugs. ?Does not drink caffeine. ?Has not had a blood transfusion. ?Patient's occupation Publishing copy. ?  ?  Notes: Occasional beer   ? ?REVIEW OF SYSTEMS:    ?GU Review Male:   Patient denies frequent urination, hard to postpone urination, burning/ pain with urination, get up at night to urinate, leakage of urine, stream starts and stops, trouble starting your stream, have to strain to urinate  , erection problems, and penile pain.  ?Gastrointestinal (Upper):   Patient denies nausea, vomiting, and indigestion/ heartburn.  ?Gastrointestinal (Lower):   Patient denies diarrhea and constipation.  ?Constitutional:   Patient denies fever, night sweats, weight loss, and fatigue.  ?Skin:   Patient denies skin rash/ lesion and itching.  ?Eyes:   Patient denies blurred vision and double vision.  ?Ears/ Nose/ Throat:   Patient denies sore throat and sinus problems.  ?Hematologic/Lymphatic:   Patient denies swollen glands and easy bruising.  ?Cardiovascular:   Patient reports leg swelling. Patient denies chest pains.  ?Respiratory:   Patient denies cough and shortness of breath.  ?Endocrine:   Patient denies excessive thirst.  ?Musculoskeletal:   Patient denies joint pain and back pain.  ?Neurological:   Patient denies headaches and dizziness.  ?Psychologic:   Patient denies depression and anxiety.  ? ?Notes: chronic leg edema that resolves with compression hose  ?  ? ?VITAL SIGNS:    ?  05/31/2021 01:25 PM  ?Weight 265 lb / 120.2 kg  ?Height 73 in / 185.42 cm  ?BP 123/72 mmHg  ?Pulse 55 /min  ?Temperature 97.3 F / 36.2 C  ?BMI 35.0 kg/m?  ? ?MULTI-SYSTEM PHYSICAL EXAMINATION:    ?  Constitutional: Well-nourished. No physical deformities. Normally developed. Good grooming.  ?Neck: Neck symmetrical, not swollen. Normal tracheal position.  ?Respiratory: Normal breath sounds. No labored breathing, no use of accessory muscles.   ?Cardiovascular: Regular rate and rhythm. No murmur, no gallop.   ?Lymphatic: No enlargement of neck, axillae, groin.  ?Skin: No paleness, no jaundice, no cyanosis. No lesion, no ulcer, no rash.  ?Neurologic / Psychiatric: Oriented to time, oriented to place, oriented to person. No depression, no anxiety, no agitation.  ?Gastrointestinal: No mass, no tenderness, no rigidity, obese abdomen.   ?Eyes: Normal conjunctivae. Normal eyelids.  ?Ears, Nose, Mouth, and Throat: Left ear no scars, no lesions,  no masses. Right ear no scars, no lesions, no masses. Nose no scars, no lesions, no masses. Normal hearing. Normal lips.  ?Musculoskeletal: Normal gait and station of head and neck.  ? ?  ?Complexity of Data:

## 2021-06-15 NOTE — Anesthesia Preprocedure Evaluation (Addendum)
Anesthesia Evaluation  ?Patient identified by MRN, date of birth, ID band ?Patient awake ? ? ? ?Reviewed: ?Allergy & Precautions, NPO status , Patient's Chart, lab work & pertinent test results ? ?History of Anesthesia Complications ?(+) PONV and history of anesthetic complications ? ?Airway ?Mallampati: III ? ?TM Distance: >3 FB ?Neck ROM: Full ? ? ? Dental ?no notable dental hx. ?(+) Dental Advisory Given ?  ?Pulmonary ?asthma , sleep apnea and Continuous Positive Airway Pressure Ventilation ,  ?  ?Pulmonary exam normal ? ? ? ? ? ? ? Cardiovascular ?negative cardio ROS ?Normal cardiovascular exam ? ? ?  ?Neuro/Psych ?negative neurological ROS ? negative psych ROS  ? GI/Hepatic ?Neg liver ROS, GERD  Medicated and Controlled,  ?Endo/Other  ?Morbid obesity ? Renal/GU ?Renal disease  ?negative genitourinary ?  ?Musculoskeletal ?negative musculoskeletal ROS ?(+)  ? Abdominal ?  ?Peds ?negative pediatric ROS ?(+)  Hematology ?negative hematology ROS ?(+)   ?Anesthesia Other Findings ? ? Reproductive/Obstetrics ?negative OB ROS ? ?  ? ? ? ? ? ? ? ? ? ? ? ? ? ?  ?  ? ? ? ? ? ? ? ?Anesthesia Physical ? ?Anesthesia Plan ? ?ASA: 2 ? ?Anesthesia Plan: General  ? ?Post-op Pain Management:   ? ?Induction: Intravenous ? ?PONV Risk Score and Plan: 4 or greater and Ondansetron, Dexamethasone, Midazolam and Scopolamine patch - Pre-op ? ?Airway Management Planned: Oral ETT ? ?Additional Equipment:  ? ?Intra-op Plan:  ? ?Post-operative Plan: Extubation in OR ? ?Informed Consent: I have reviewed the patients History and Physical, chart, labs and discussed the procedure including the risks, benefits and alternatives for the proposed anesthesia with the patient or authorized representative who has indicated his/her understanding and acceptance.  ? ? ? ? ? ?Plan Discussed with: Anesthesiologist and CRNA ? ?Anesthesia Plan Comments:   ? ? ? ? ? ?Anesthesia Quick Evaluation ? ?

## 2021-06-16 ENCOUNTER — Observation Stay (HOSPITAL_COMMUNITY)
Admission: RE | Admit: 2021-06-16 | Discharge: 2021-06-17 | Disposition: A | Discharge status: Home / Self Care | Payer: BC Managed Care – PPO | Source: Ambulatory Visit | Attending: Urology | Admitting: Urology

## 2021-06-16 ENCOUNTER — Encounter (HOSPITAL_COMMUNITY): Payer: Self-pay | Admitting: Urology

## 2021-06-16 ENCOUNTER — Ambulatory Visit (HOSPITAL_COMMUNITY): Payer: BC Managed Care – PPO | Admitting: Anesthesiology

## 2021-06-16 ENCOUNTER — Encounter (HOSPITAL_COMMUNITY)
Admission: RE | Disposition: A | Discharge status: Home / Self Care | Payer: Self-pay | Source: Ambulatory Visit | Attending: Urology

## 2021-06-16 ENCOUNTER — Other Ambulatory Visit: Payer: Self-pay

## 2021-06-16 DIAGNOSIS — J45909 Unspecified asthma, uncomplicated: Secondary | ICD-10-CM | POA: Insufficient documentation

## 2021-06-16 DIAGNOSIS — C61 Malignant neoplasm of prostate: Secondary | ICD-10-CM | POA: Diagnosis not present

## 2021-06-16 DIAGNOSIS — Z91040 Latex allergy status: Secondary | ICD-10-CM | POA: Insufficient documentation

## 2021-06-16 DIAGNOSIS — Z79899 Other long term (current) drug therapy: Secondary | ICD-10-CM | POA: Insufficient documentation

## 2021-06-16 HISTORY — PX: LYMPHADENECTOMY: SHX5960

## 2021-06-16 HISTORY — PX: ROBOT ASSISTED LAPAROSCOPIC RADICAL PROSTATECTOMY: SHX5141

## 2021-06-16 LAB — ABO/RH: ABO/RH(D): O POS

## 2021-06-16 LAB — TYPE AND SCREEN
ABO/RH(D): O POS
Antibody Screen: NEGATIVE

## 2021-06-16 LAB — HEMOGLOBIN AND HEMATOCRIT, BLOOD
HCT: 44.4 % (ref 39.0–52.0)
Hemoglobin: 15 g/dL (ref 13.0–17.0)

## 2021-06-16 SURGERY — XI ROBOTIC ASSISTED LAPAROSCOPIC RADICAL PROSTATECTOMY LEVEL 2
Anesthesia: General | Site: Abdomen

## 2021-06-16 MED ORDER — KETAMINE HCL 10 MG/ML IJ SOLN
INTRAMUSCULAR | Status: DC | PRN
  Administered 2021-06-16: 50 mg via INTRAVENOUS

## 2021-06-16 MED ORDER — SODIUM CHLORIDE 0.9 % IV BOLUS
1000.0000 mL | Freq: Once | INTRAVENOUS | Status: AC
  Administered 2021-06-16: 1000 mL via INTRAVENOUS

## 2021-06-16 MED ORDER — ACETAMINOPHEN 325 MG PO TABS: 650.0000 mg | ORAL_TABLET | ORAL | Status: DC | PRN

## 2021-06-16 MED ORDER — CEFAZOLIN SODIUM-DEXTROSE 2-4 GM/100ML-% IV SOLN
2.0000 g | Freq: Once | INTRAVENOUS | Status: AC
  Administered 2021-06-16: 2 g via INTRAVENOUS
  Filled 2021-06-16: qty 100

## 2021-06-16 MED ORDER — CELECOXIB 200 MG PO CAPS
200.0000 mg | ORAL_CAPSULE | Freq: Once | ORAL | Status: AC
  Administered 2021-06-16: 200 mg via ORAL
  Filled 2021-06-16: qty 1

## 2021-06-16 MED ORDER — FENTANYL CITRATE (PF) 100 MCG/2ML IJ SOLN
INTRAMUSCULAR | Status: AC
  Filled 2021-06-16: qty 2

## 2021-06-16 MED ORDER — KETOROLAC TROMETHAMINE 15 MG/ML IJ SOLN
INTRAMUSCULAR | Status: AC
  Filled 2021-06-16: qty 1

## 2021-06-16 MED ORDER — DOCUSATE SODIUM 100 MG PO CAPS: 100.0000 mg | ORAL_CAPSULE | Freq: Two times a day (BID) | ORAL | Status: AC

## 2021-06-16 MED ORDER — METHOCARBAMOL 500 MG PO TABS
500.0000 mg | ORAL_TABLET | Freq: Four times a day (QID) | ORAL | Status: DC | PRN
  Administered 2021-06-16 – 2021-06-17 (×2): 500 mg via ORAL
  Filled 2021-06-16 (×3): qty 1

## 2021-06-16 MED ORDER — PHENYLEPHRINE 80 MCG/ML (10ML) SYRINGE FOR IV PUSH (FOR BLOOD PRESSURE SUPPORT)
PREFILLED_SYRINGE | INTRAVENOUS | Status: DC | PRN
  Administered 2021-06-16: 80 ug via INTRAVENOUS
  Administered 2021-06-16 (×3): 160 ug via INTRAVENOUS
  Administered 2021-06-16: 320 ug via INTRAVENOUS

## 2021-06-16 MED ORDER — ROCURONIUM BROMIDE 10 MG/ML (PF) SYRINGE
PREFILLED_SYRINGE | INTRAVENOUS | Status: DC | PRN
  Administered 2021-06-16: 20 mg via INTRAVENOUS
  Administered 2021-06-16: 100 mg via INTRAVENOUS

## 2021-06-16 MED ORDER — CHLORHEXIDINE GLUCONATE 0.12 % MT SOLN
15.0000 mL | Freq: Once | OROMUCOSAL | Status: AC
  Administered 2021-06-16: 15 mL via OROMUCOSAL

## 2021-06-16 MED ORDER — DOCUSATE SODIUM 100 MG PO CAPS
100.0000 mg | ORAL_CAPSULE | Freq: Two times a day (BID) | ORAL | Status: DC
  Administered 2021-06-16 – 2021-06-17 (×3): 100 mg via ORAL
  Filled 2021-06-16 (×3): qty 1

## 2021-06-16 MED ORDER — PHENYLEPHRINE HCL-NACL 20-0.9 MG/250ML-% IV SOLN
INTRAVENOUS | Status: AC
  Filled 2021-06-16: qty 250

## 2021-06-16 MED ORDER — LACTATED RINGERS IR SOLN
Status: DC | PRN
  Administered 2021-06-16: 1000 mL

## 2021-06-16 MED ORDER — ALBUTEROL SULFATE HFA 108 (90 BASE) MCG/ACT IN AERS
INHALATION_SPRAY | RESPIRATORY_TRACT | Status: AC
  Filled 2021-06-16: qty 6.7

## 2021-06-16 MED ORDER — BUPIVACAINE-EPINEPHRINE (PF) 0.25% -1:200000 IJ SOLN
INTRAMUSCULAR | Status: AC
  Filled 2021-06-16: qty 30

## 2021-06-16 MED ORDER — LACTATED RINGERS IV SOLN: INTRAVENOUS | Status: DC

## 2021-06-16 MED ORDER — DIPHENHYDRAMINE HCL 50 MG/ML IJ SOLN: 12.5000 mg | Freq: Four times a day (QID) | INTRAMUSCULAR | Status: DC | PRN

## 2021-06-16 MED ORDER — FLUTICASONE PROPIONATE 50 MCG/ACT NA SUSP
1.0000 | Freq: Every day | NASAL | Status: DC
  Administered 2021-06-16: 1 via NASAL
  Filled 2021-06-16: qty 16

## 2021-06-16 MED ORDER — SODIUM CHLORIDE 0.9 % IR SOLN
Status: DC | PRN
Start: 2021-06-16 — End: 2021-06-16
  Administered 2021-06-16: 1000 mL via INTRAVESICAL

## 2021-06-16 MED ORDER — ROCURONIUM BROMIDE 10 MG/ML (PF) SYRINGE
PREFILLED_SYRINGE | INTRAVENOUS | Status: AC
  Filled 2021-06-16: qty 10

## 2021-06-16 MED ORDER — GABAPENTIN 300 MG PO CAPS
300.0000 mg | ORAL_CAPSULE | Freq: Every day | ORAL | Status: DC
  Administered 2021-06-16: 300 mg via ORAL
  Filled 2021-06-16: qty 1

## 2021-06-16 MED ORDER — KETOROLAC TROMETHAMINE 15 MG/ML IJ SOLN
15.0000 mg | Freq: Four times a day (QID) | INTRAMUSCULAR | Status: DC
  Administered 2021-06-16 – 2021-06-17 (×4): 15 mg via INTRAVENOUS
  Filled 2021-06-16 (×4): qty 1

## 2021-06-16 MED ORDER — LORATADINE 10 MG PO TABS
10.0000 mg | ORAL_TABLET | Freq: Every day | ORAL | Status: DC
  Administered 2021-06-17: 10 mg via ORAL
  Filled 2021-06-16: qty 1

## 2021-06-16 MED ORDER — FENTANYL CITRATE (PF) 100 MCG/2ML IJ SOLN
INTRAMUSCULAR | Status: DC | PRN
  Administered 2021-06-16 (×3): 50 ug via INTRAVENOUS

## 2021-06-16 MED ORDER — MIDAZOLAM HCL 5 MG/5ML IJ SOLN
INTRAMUSCULAR | Status: DC | PRN
  Administered 2021-06-16: 2 mg via INTRAVENOUS

## 2021-06-16 MED ORDER — PROPOFOL 10 MG/ML IV BOLUS
INTRAVENOUS | Status: DC | PRN
  Administered 2021-06-16: 100 mg via INTRAVENOUS
  Administered 2021-06-16: 200 mg via INTRAVENOUS

## 2021-06-16 MED ORDER — PHENYLEPHRINE 80 MCG/ML (10ML) SYRINGE FOR IV PUSH (FOR BLOOD PRESSURE SUPPORT)
PREFILLED_SYRINGE | INTRAVENOUS | Status: AC
  Filled 2021-06-16: qty 10

## 2021-06-16 MED ORDER — CEFAZOLIN SODIUM-DEXTROSE 1-4 GM/50ML-% IV SOLN
1.0000 g | Freq: Three times a day (TID) | INTRAVENOUS | Status: AC
  Administered 2021-06-16 (×2): 1 g via INTRAVENOUS
  Filled 2021-06-16 (×2): qty 50

## 2021-06-16 MED ORDER — BUPIVACAINE-EPINEPHRINE 0.25% -1:200000 IJ SOLN
INTRAMUSCULAR | Status: DC | PRN
  Administered 2021-06-16: 11 mL
  Administered 2021-06-16: 19 mL

## 2021-06-16 MED ORDER — DIPHENHYDRAMINE HCL 12.5 MG/5ML PO ELIX: 12.5000 mg | ORAL_SOLUTION | Freq: Four times a day (QID) | ORAL | Status: DC | PRN

## 2021-06-16 MED ORDER — LACTATED RINGERS IV SOLN: INTRAVENOUS | Status: DC | PRN

## 2021-06-16 MED ORDER — PROPOFOL 10 MG/ML IV BOLUS
INTRAVENOUS | Status: AC
  Filled 2021-06-16: qty 20

## 2021-06-16 MED ORDER — FENTANYL CITRATE PF 50 MCG/ML IJ SOSY: 25.0000 ug | PREFILLED_SYRINGE | INTRAMUSCULAR | Status: DC | PRN

## 2021-06-16 MED ORDER — AMISULPRIDE (ANTIEMETIC) 5 MG/2ML IV SOLN: 10.0000 mg | Freq: Once | INTRAVENOUS | Status: DC | PRN

## 2021-06-16 MED ORDER — LIDOCAINE 2% (20 MG/ML) 5 ML SYRINGE
INTRAMUSCULAR | Status: DC | PRN
Start: 2021-06-16 — End: 2021-06-16
  Administered 2021-06-16: 100 mg via INTRAVENOUS

## 2021-06-16 MED ORDER — POLYETHYLENE GLYCOL 3350 17 G PO PACK
17.0000 g | PACK | Freq: Every day | ORAL | Status: DC
  Filled 2021-06-16: qty 1

## 2021-06-16 MED ORDER — LACTATED RINGERS IV SOLN
INTRAVENOUS | Status: DC | PRN
  Administered 2021-06-16: 1 mL

## 2021-06-16 MED ORDER — AZELASTINE HCL 0.1 % NA SOLN
1.0000 | Freq: Every day | NASAL | Status: DC
  Administered 2021-06-17: 1 via NASAL
  Filled 2021-06-16: qty 30

## 2021-06-16 MED ORDER — ALLOPURINOL 300 MG PO TABS
300.0000 mg | ORAL_TABLET | Freq: Every day | ORAL | Status: DC
  Administered 2021-06-16: 300 mg via ORAL
  Filled 2021-06-16: qty 1

## 2021-06-16 MED ORDER — EPHEDRINE 5 MG/ML INJ
INTRAVENOUS | Status: AC
  Filled 2021-06-16: qty 5

## 2021-06-16 MED ORDER — PANTOPRAZOLE SODIUM 40 MG PO TBEC
40.0000 mg | DELAYED_RELEASE_TABLET | Freq: Every day | ORAL | Status: DC
  Administered 2021-06-16 – 2021-06-17 (×2): 40 mg via ORAL
  Filled 2021-06-16 (×2): qty 1

## 2021-06-16 MED ORDER — MORPHINE SULFATE (PF) 2 MG/ML IV SOLN
2.0000 mg | INTRAVENOUS | Status: DC | PRN
  Administered 2021-06-16 (×3): 2 mg via INTRAVENOUS
  Filled 2021-06-16 (×2): qty 1

## 2021-06-16 MED ORDER — SUGAMMADEX SODIUM 200 MG/2ML IV SOLN
INTRAVENOUS | Status: DC | PRN
  Administered 2021-06-16: 250 mg via INTRAVENOUS

## 2021-06-16 MED ORDER — STERILE WATER FOR IRRIGATION IR SOLN
Status: DC | PRN
  Administered 2021-06-16: 1000 mL

## 2021-06-16 MED ORDER — BACITRACIN-NEOMYCIN-POLYMYXIN 400-5-5000 EX OINT: 1.0000 "application " | TOPICAL_OINTMENT | Freq: Three times a day (TID) | CUTANEOUS | Status: DC | PRN

## 2021-06-16 MED ORDER — ORAL CARE MOUTH RINSE: 15.0000 mL | Freq: Once | OROMUCOSAL | Status: AC

## 2021-06-16 MED ORDER — MONTELUKAST SODIUM 10 MG PO TABS
10.0000 mg | ORAL_TABLET | Freq: Every day | ORAL | Status: DC
  Administered 2021-06-16: 10 mg via ORAL
  Filled 2021-06-16: qty 1

## 2021-06-16 MED ORDER — MIDAZOLAM HCL 2 MG/2ML IJ SOLN
INTRAMUSCULAR | Status: AC
  Filled 2021-06-16: qty 2

## 2021-06-16 MED ORDER — TRAMADOL HCL 50 MG PO TABS: 50.0000 mg | ORAL_TABLET | Freq: Four times a day (QID) | ORAL | 0 refills | Status: DC | PRN

## 2021-06-16 MED ORDER — FLEET ENEMA 7-19 GM/118ML RE ENEM: 1.0000 | ENEMA | Freq: Once | RECTAL | Status: DC

## 2021-06-16 MED ORDER — SCOPOLAMINE 1 MG/3DAYS TD PT72
1.0000 | MEDICATED_PATCH | TRANSDERMAL | Status: DC
  Administered 2021-06-16: 1.5 mg via TRANSDERMAL
  Filled 2021-06-16 (×2): qty 1

## 2021-06-16 MED ORDER — ACETAMINOPHEN 500 MG PO TABS
1000.0000 mg | ORAL_TABLET | Freq: Once | ORAL | Status: AC
  Administered 2021-06-16: 1000 mg via ORAL
  Filled 2021-06-16: qty 2

## 2021-06-16 MED ORDER — KETAMINE HCL 50 MG/5ML IJ SOSY
PREFILLED_SYRINGE | INTRAMUSCULAR | Status: AC
  Filled 2021-06-16: qty 5

## 2021-06-16 MED ORDER — SULFAMETHOXAZOLE-TRIMETHOPRIM 800-160 MG PO TABS: 1.0000 | ORAL_TABLET | Freq: Two times a day (BID) | ORAL | 0 refills | Status: DC

## 2021-06-16 MED ORDER — EPHEDRINE SULFATE-NACL 50-0.9 MG/10ML-% IV SOSY
PREFILLED_SYRINGE | INTRAVENOUS | Status: DC | PRN
  Administered 2021-06-16: 5 mg via INTRAVENOUS
  Administered 2021-06-16: 15 mg via INTRAVENOUS
  Administered 2021-06-16 (×4): 10 mg via INTRAVENOUS

## 2021-06-16 MED ORDER — EPHEDRINE 5 MG/ML INJ
INTRAVENOUS | Status: AC
  Filled 2021-06-16: qty 15

## 2021-06-16 MED ORDER — KCL IN DEXTROSE-NACL 20-5-0.45 MEQ/L-%-% IV SOLN
INTRAVENOUS | Status: DC
  Filled 2021-06-16 (×4): qty 1000

## 2021-06-16 MED ORDER — ZOLPIDEM TARTRATE 5 MG PO TABS: 5.0000 mg | ORAL_TABLET | Freq: Every evening | ORAL | Status: DC | PRN

## 2021-06-16 MED ORDER — DEXAMETHASONE SODIUM PHOSPHATE 10 MG/ML IJ SOLN
INTRAMUSCULAR | Status: DC | PRN
  Administered 2021-06-16: 10 mg via INTRAVENOUS

## 2021-06-16 MED ORDER — ONDANSETRON HCL 4 MG/2ML IJ SOLN
INTRAMUSCULAR | Status: DC | PRN
  Administered 2021-06-16: 4 mg via INTRAVENOUS

## 2021-06-16 MED ORDER — HEPARIN SODIUM (PORCINE) 1000 UNIT/ML IJ SOLN
INTRAMUSCULAR | Status: AC
  Filled 2021-06-16: qty 1

## 2021-06-16 MED ORDER — ALBUTEROL SULFATE HFA 108 (90 BASE) MCG/ACT IN AERS
INHALATION_SPRAY | RESPIRATORY_TRACT | Status: DC | PRN
  Administered 2021-06-16 (×2): 2 via RESPIRATORY_TRACT

## 2021-06-16 MED ORDER — ONDANSETRON HCL 4 MG/2ML IJ SOLN: 4.0000 mg | INTRAMUSCULAR | Status: DC | PRN

## 2021-06-16 MED ORDER — MORPHINE SULFATE (PF) 2 MG/ML IV SOLN
INTRAVENOUS | Status: AC
  Administered 2021-06-16: 2 mg via INTRAVENOUS
  Filled 2021-06-16: qty 1

## 2021-06-16 SURGICAL SUPPLY — 72 items
APPLICATOR COTTON TIP 6 STRL (MISCELLANEOUS) ×2 IMPLANT
APPLICATOR COTTON TIP 6IN STRL (MISCELLANEOUS) ×3
BAG COUNTER SPONGE SURGICOUNT (BAG) IMPLANT
BAG RETRIEVAL 10 (BASKET) ×1
CATH FOLEY 2WAY SLVR 18FR 30CC (CATHETERS) ×2 IMPLANT
CATH ROBINSON RED A/P 16FR (CATHETERS) ×2 IMPLANT
CATH ROBINSON RED A/P 8FR (CATHETERS) ×2 IMPLANT
CATH SILICON 18FR 30CC (CATHETERS) ×1 IMPLANT
CATH SILICONE 16FRX5CC (CATHETERS) ×1 IMPLANT
CATH SILICONE 5CC 18FR (INSTRUMENTS) ×1 IMPLANT
CATH TIEMANN FOLEY 18FR 5CC (CATHETERS) ×2 IMPLANT
CHLORAPREP W/TINT 26 (MISCELLANEOUS) ×3 IMPLANT
CLIP LIGATING HEM O LOK PURPLE (MISCELLANEOUS) ×8 IMPLANT
COVER SURGICAL LIGHT HANDLE (MISCELLANEOUS) ×3 IMPLANT
COVER TIP SHEARS 8 DVNC (MISCELLANEOUS) ×2 IMPLANT
COVER TIP SHEARS 8MM DA VINCI (MISCELLANEOUS) ×1
CUTTER ECHEON FLEX ENDO 45 340 (ENDOMECHANICALS) ×3 IMPLANT
DERMABOND ADVANCED (GAUZE/BANDAGES/DRESSINGS) ×1
DERMABOND ADVANCED .7 DNX12 (GAUZE/BANDAGES/DRESSINGS) ×2 IMPLANT
DRAIN CHANNEL RND F F (WOUND CARE) IMPLANT
DRAPE ARM DVNC X/XI (DISPOSABLE) ×8 IMPLANT
DRAPE COLUMN DVNC XI (DISPOSABLE) ×2 IMPLANT
DRAPE DA VINCI XI ARM (DISPOSABLE) ×4
DRAPE DA VINCI XI COLUMN (DISPOSABLE) ×1
DRAPE SURG IRRIG POUCH 19X23 (DRAPES) ×3 IMPLANT
DRSG TEGADERM 4X4.75 (GAUZE/BANDAGES/DRESSINGS) ×3 IMPLANT
ELECT PENCIL ROCKER SW 15FT (MISCELLANEOUS) ×3 IMPLANT
ELECT REM PT RETURN 15FT ADLT (MISCELLANEOUS) ×3 IMPLANT
GAUZE 4X4 16PLY ~~LOC~~+RFID DBL (SPONGE) ×2 IMPLANT
GAUZE SPONGE 4X4 12PLY STRL (GAUZE/BANDAGES/DRESSINGS) ×3 IMPLANT
GLOVE BIO SURGEON STRL SZ 6.5 (GLOVE) ×2 IMPLANT
GLOVE BIOGEL PI IND STRL 6.5 (GLOVE) IMPLANT
GLOVE BIOGEL PI IND STRL 7.5 (GLOVE) IMPLANT
GLOVE BIOGEL PI INDICATOR 6.5 (GLOVE) ×1
GLOVE BIOGEL PI INDICATOR 7.5 (GLOVE) ×2
GLOVE SURG LX 7.5 STRW (GLOVE)
GLOVE SURG LX STRL 7.5 STRW (GLOVE) ×4 IMPLANT
HOLDER FOLEY CATH W/STRAP (MISCELLANEOUS) ×3 IMPLANT
IRRIG SUCT STRYKERFLOW 2 WTIP (MISCELLANEOUS) ×3
IRRIGATION SUCT STRKRFLW 2 WTP (MISCELLANEOUS) ×2 IMPLANT
IV LACTATED RINGERS 1000ML (IV SOLUTION) ×3 IMPLANT
KIT TURNOVER KIT A (KITS) IMPLANT
NDL SAFETY ECLIPSE 18X1.5 (NEEDLE) ×2 IMPLANT
NEEDLE HYPO 18GX1.5 SHARP (NEEDLE) ×1
PACK ROBOT UROLOGY CUSTOM (CUSTOM PROCEDURE TRAY) ×3 IMPLANT
RELOAD STAPLE 45 4.1 GRN THCK (STAPLE) ×2 IMPLANT
SEAL CANN UNIV 5-8 DVNC XI (MISCELLANEOUS) ×8 IMPLANT
SEAL XI 5MM-8MM UNIVERSAL (MISCELLANEOUS) ×4
SET TUBE SMOKE EVAC HIGH FLOW (TUBING) ×3 IMPLANT
SOLUTION ELECTROLUBE (MISCELLANEOUS) ×3 IMPLANT
SPIKE FLUID TRANSFER (MISCELLANEOUS) ×3 IMPLANT
STAPLE RELOAD 45 GRN (STAPLE) ×2 IMPLANT
STAPLE RELOAD 45MM GREEN (STAPLE) ×1
SUT ETHILON 3 0 PS 1 (SUTURE) ×3 IMPLANT
SUT MNCRL 3 0 RB1 (SUTURE) ×2 IMPLANT
SUT MNCRL 3 0 VIOLET RB1 (SUTURE) ×2 IMPLANT
SUT MNCRL AB 4-0 PS2 18 (SUTURE) ×6 IMPLANT
SUT MONOCRYL 3 0 RB1 (SUTURE) ×3
SUT PDS PLUS 0 (SUTURE) ×2
SUT PDS PLUS AB 0 CT-2 (SUTURE) ×4 IMPLANT
SUT VIC AB 0 CT1 27 (SUTURE) ×2
SUT VIC AB 0 CT1 27XBRD ANTBC (SUTURE) ×4 IMPLANT
SUT VIC AB 2-0 SH 27 (SUTURE) ×1
SUT VIC AB 2-0 SH 27X BRD (SUTURE) ×2 IMPLANT
SYR 27GX1/2 1ML LL SAFETY (SYRINGE) ×3 IMPLANT
SYS BAG RETRIEVAL 10MM (BASKET) ×2
SYSTEM BAG RETRIEVAL 10MM (BASKET) IMPLANT
TOWEL OR NON WOVEN STRL DISP B (DISPOSABLE) ×3 IMPLANT
TROCAR XCEL NON-BLD 5MMX100MML (ENDOMECHANICALS) IMPLANT
TROCAR Z-THREAD FIOS 12X100MM (TROCAR) ×1 IMPLANT
TROCAR Z-THREAD FIOS 5X100MM (TROCAR) ×1 IMPLANT
WATER STERILE IRR 1000ML POUR (IV SOLUTION) ×3 IMPLANT

## 2021-06-16 NOTE — Interval H&P Note (Signed)
History and Physical Interval Note: ? ?06/16/2021 ?7:01 AM ? ?Gregory [name]  has presented today for surgery, with the diagnosis of PROSTATE CANCER.  The various methods of treatment have been discussed with the patient and family. After consideration of risks, benefits and other options for treatment, the patient has consented to  Procedure(s): ?XI ROBOTIC ASSISTED LAPAROSCOPIC RADICAL PROSTATECTOMY LEVEL 2 (N/A) ?LYMPHADENECTOMY, PELVIC (Bilateral) as a surgical intervention.  The patient's history has been reviewed, patient examined, no change in status, stable for surgery.  I have reviewed the patient's chart and labs.  Questions were answered to the patient's satisfaction.   ? ? ?Les Alinda Money ? ? ?

## 2021-06-16 NOTE — Discharge Instructions (Signed)

## 2021-06-16 NOTE — Anesthesia Postprocedure Evaluation (Signed)
Anesthesia Post Note ? ?Patient: SERAJ DUNNAM ? ?Procedure(s) Performed: XI ROBOTIC ASSISTED LAPAROSCOPIC RADICAL PROSTATECTOMY LEVEL 2 (Abdomen) ?LYMPHADENECTOMY, PELVIC (Bilateral: Abdomen) ? ?  ? ?Patient location during evaluation: PACU ?Anesthesia Type: General ?Level of consciousness: sedated ?Pain management: pain level controlled ?Vital Signs Assessment: post-procedure vital signs reviewed and stable ?Respiratory status: spontaneous breathing and respiratory function stable ?Cardiovascular status: stable ?Postop Assessment: no apparent nausea or vomiting ?Anesthetic complications: no ? ? ?No notable events documented. ? ?Last Vitals:  ?Vitals:  ? 06/16/21 1100 06/16/21 1115  ?BP: (!) 155/77 (!) 150/81  ?Pulse: 90 88  ?Resp: 18 17  ?Temp:  (!) 36.4 ?C  ?SpO2: 100% 100%  ?  ?Last Pain:  ?Vitals:  ? 06/16/21 1145  ?TempSrc:   ?PainSc: 7   ? ? ?  ?  ?  ?  ?  ?  ? ?Jasmain Ahlberg DANIEL ? ? ? ? ?

## 2021-06-16 NOTE — Op Note (Signed)
Preoperative diagnosis: Clinically localized adenocarcinoma of the prostate (clinical stage T1c N0 Mx) ? ?Postoperative diagnosis: Clinically localized adenocarcinoma of the prostate (clinical stage T1c N0 Mx) ? ?Procedure: ? ?Robotic assisted laparoscopic radical prostatectomy (bilateral nerve sparing) ?Bilateral robotic assisted laparoscopic pelvic lymphadenectomy ? ?Surgeon: Roxy Horseman, Brooke Bonito. M.D. ? ?Assistant: Debbrah Alar, PA-C ? ?An assistant was required for this surgical procedure.  The duties of the assistant included but were not limited to suctioning, passing suture, camera manipulation, retraction. This procedure would not be able to be performed without an Environmental consultant. ? ?Resident: Dr. Jimmy Footman ? ?Anesthesia: General ? ?Complications: None ? ?EBL: 300 mL ? ?IVF:  1500 mL crystalloid ? ?Specimens: ?Prostate and seminal vesicles ?Right pelvic lymph nodes ?Left pelvic lymph nodes ? ?Disposition of specimens: Pathology ? ?Drains: ?20 Fr coude catheter ?# 19 Blake pelvic drain ? ?Indication: Gregory [name] is a 61 y.o. year old patient with clinically localized prostate cancer.  After a thorough review of the management options for treatment of prostate cancer, he elected to proceed with surgical therapy and the above procedure(s).  We have discussed the potential benefits and risks of the procedure, side effects of the proposed treatment, the likelihood of the patient achieving the goals of the procedure, and any potential problems that might occur during the procedure or recuperation. Informed consent has been obtained. ? ?Description of procedure: ? ?The patient was taken to the operating room and a general anesthetic was administered. He was given preoperative antibiotics, placed in the dorsal lithotomy position, and prepped and draped in the usual sterile fashion. Next a preoperative timeout was performed. A urethral catheter was placed into the bladder and a site was selected near the  umbilicus for placement of the camera port. This was placed using a standard open Hassan technique which allowed entry into the peritoneal cavity under direct vision and without difficulty. An 8 mm robotic port was placed and a pneumoperitoneum established. The camera was then used to inspect the abdomen and there was no evidence of any intra-abdominal injuries or other abnormalities. The remaining abdominal ports were then placed. 8 mm robotic ports were placed in the right lower quadrant, left lower quadrant, and far left lateral abdominal wall. A 5 mm port was placed in the right upper quadrant and a 12 mm port was placed in the right lateral abdominal wall for laparoscopic assistance. All ports were placed under direct vision without difficulty. The surgical cart was then docked.  ? ?Utilizing the cautery scissors, the bladder was reflected posteriorly allowing entry into the space of Retzius and identification of the endopelvic fascia and prostate. The periprostatic fat was then removed from the prostate allowing full exposure of the endopelvic fascia. The endopelvic fascia was then incised from the apex back to the base of the prostate bilaterally and the underlying levator muscle fibers were swept laterally off the prostate thereby isolating the dorsal venous complex. The dorsal vein was then stapled and divided with a 45 mm Flex Echelon stapler. Attention then turned to the bladder neck which was divided anteriorly thereby allowing entry into the bladder and exposure of the urethral catheter. The catheter balloon was deflated and the catheter was brought into the operative field and used to retract the prostate anteriorly. The posterior bladder neck was then examined and was divided allowing further dissection between the bladder and prostate posteriorly until the vasa deferentia and seminal vessels were identified. The vasa deferentia were isolated, divided, and lifted anteriorly. The seminal  vesicles were  dissected down to their tips with care to control the seminal vascular arterial blood supply. These structures were then lifted anteriorly and the space between Denonvillier?s fascia and the anterior rectum was developed with a combination of sharp and blunt dissection. This isolated the vascular pedicles of the prostate. ? ?The lateral prostatic fascia was then sharply incised allowing release of the neurovascular bundles bilaterally. The vascular pedicles of the prostate were then ligated with Weck clips between the prostate and neurovascular bundles and divided with sharp cold scissor dissection resulting in neurovascular bundle preservation. The neurovascular bundles were then separated off the apex of the prostate and urethra bilaterally. ? ?The urethra was then sharply transected allowing the prostate specimen to be disarticulated. The pelvis was copiously irrigated and hemostasis was ensured. There was no evidence for rectal injury. ? ?Attention then turned to the right pelvic sidewall. The fibrofatty tissue between the external iliac vein, confluence of the iliac vessels, hypogastric artery, and Cooper's ligament was dissected free from the pelvic sidewall with care to preserve the obturator nerve. Weck clips were used for lymphostasis and hemostasis. An identical procedure was performed on the contralateral side and the lymphatic packets were removed for permanent pathologic analysis. ? ?Attention then turned to the urethral anastomosis. A 2-0 Vicryl slip knot was placed between Denonvillier?s fascia, the posterior bladder neck, and the posterior urethra to reapproximate these structures. A double-armed 3-0 Monocryl suture was then used to perform a 360? running tension-free anastomosis between the bladder neck and urethra. A new urethral catheter was then placed into the bladder and irrigated. There were no blood clots within the bladder and the anastomosis appeared to be watertight. A #19 Blake drain was  then brought through the left lateral 8 mm port site and positioned appropriately within the pelvis. It was secured to the skin with a nylon suture. The surgical cart was then undocked. The right lateral 12 mm port site was closed at the fascial level with a 0 Vicryl suture placed laparoscopically. All remaining ports were then removed under direct vision. The prostate specimen was removed intact within the Endopouch retrieval bag via the periumbilical camera port site. This fascial opening was closed with two running 0 PDS sutures. 0.25% Marcaine was then injected into all port sites and all incisions were reapproximated at the skin level with 4-0 Monocryl subcuticular sutures and Dermabond. The patient appeared to tolerate the procedure well and without complications. The patient was able to be extubated and transferred to the recovery unit in satisfactory condition. ? ? ?Pryor Curia MD  ?

## 2021-06-16 NOTE — Progress Notes (Signed)
Pt ambulate back and forth the hallway. ?

## 2021-06-16 NOTE — Anesthesia Procedure Notes (Signed)
Procedure Name: Intubation ?Date/Time: 06/16/2021 7:27 AM ?Performed by: Rosaland Lao, CRNA ?Pre-anesthesia Checklist: Patient identified, Emergency Drugs available, Suction available and Patient being monitored ?Patient Re-evaluated:Patient Re-evaluated prior to induction ?Oxygen Delivery Method: Circle system utilized ?Preoxygenation: Pre-oxygenation with 100% oxygen ?Induction Type: IV induction ?Ventilation: Two handed mask ventilation required and Oral airway inserted - appropriate to patient size ?Laryngoscope Size: Glidescope ?Grade View: Grade I ?Tube type: Oral ?Tube size: 7.5 mm ?Number of attempts: 3 ?Airway Equipment and Method: Stylet, Oral airway, Bougie stylet and Video-laryngoscopy ?Placement Confirmation: ETT inserted through vocal cords under direct vision, positive ETCO2 and breath sounds checked- equal and bilateral ?Secured at: 23 cm ?Tube secured with: Tape ?Dental Injury: Teeth and Oropharynx as per pre-operative assessment  ?Difficulty Due To: Difficulty was unanticipated, Difficult Airway- due to anterior larynx, Difficult Airway- due to large tongue and Difficult Airway- due to dentition ?Future Recommendations: Recommend- induction with short-acting agent, and alternative techniques readily available ?Comments: DL with mac 4, grade 3 view, attempted to pass with Bougie. BMV without difficulty, DL with miller 2 grade 3 view. BMV, glidescope with 4 blade for successful intubation. Recommend glidescope in the future.  ? ? ? ? ?

## 2021-06-16 NOTE — Progress Notes (Signed)
Placed pt on cpap 2L bled in. Placed pt on home settins 7 cm. ?

## 2021-06-16 NOTE — Transfer of Care (Signed)
Immediate Anesthesia Transfer of Care Note ? ?Patient: Gregory [name] ? ?Procedure(s) Performed: XI ROBOTIC ASSISTED LAPAROSCOPIC RADICAL PROSTATECTOMY LEVEL 2 (Abdomen) ?LYMPHADENECTOMY, PELVIC (Bilateral: Abdomen) ? ?Patient Location: PACU ? ?Anesthesia Type:General ? ?Level of Consciousness: awake, alert  and oriented ? ?Airway & Oxygen Therapy: Patient Spontanous Breathing and Patient connected to face mask ? ?Post-op Assessment: Report given to RN and Post -op Vital signs reviewed and stable ? ?Post vital signs: Reviewed and stable ? ?Last Vitals:  ?Vitals Value Taken Time  ?BP 144/86 06/16/21 1039  ?Temp    ?Pulse 94 06/16/21 1042  ?Resp 19 06/16/21 1042  ?SpO2 100 % 06/16/21 1042  ?Vitals shown include unvalidated device data. ? ?Last Pain:  ?Vitals:  ? 06/16/21 0541  ?TempSrc: Oral  ?PainSc:   ?   ? ?Patients Stated Pain Goal: 4 (06/16/21 0537) ? ?Complications: No notable events documented. ?

## 2021-06-17 DIAGNOSIS — Z79899 Other long term (current) drug therapy: Secondary | ICD-10-CM | POA: Diagnosis not present

## 2021-06-17 DIAGNOSIS — C61 Malignant neoplasm of prostate: Secondary | ICD-10-CM | POA: Diagnosis not present

## 2021-06-17 DIAGNOSIS — Z91040 Latex allergy status: Secondary | ICD-10-CM | POA: Diagnosis not present

## 2021-06-17 DIAGNOSIS — J45909 Unspecified asthma, uncomplicated: Secondary | ICD-10-CM | POA: Diagnosis not present

## 2021-06-17 LAB — HEMOGLOBIN AND HEMATOCRIT, BLOOD
HCT: 37.7 % — ABNORMAL LOW (ref 39.0–52.0)
Hemoglobin: 12.9 g/dL — ABNORMAL LOW (ref 13.0–17.0)

## 2021-06-17 MED ORDER — TRAMADOL HCL 50 MG PO TABS
50.0000 mg | ORAL_TABLET | Freq: Four times a day (QID) | ORAL | Status: DC | PRN
  Administered 2021-06-17: 100 mg via ORAL
  Filled 2021-06-17: qty 2

## 2021-06-17 MED ORDER — BISACODYL 10 MG RE SUPP
10.0000 mg | Freq: Once | RECTAL | Status: AC
  Administered 2021-06-17: 10 mg via RECTAL
  Filled 2021-06-17: qty 1

## 2021-06-17 NOTE — Discharge Summary (Signed)
Date of admission: 06/16/2021 ? ?Date of discharge: 06/17/2021 ? ?Admission diagnosis: Prostate cancer ? ?Discharge diagnosis: Prostate cancer ? ?Secondary diagnoses:  ?Patient Active Problem List  ? Diagnosis Date Noted  ? Prostate cancer ([place]) 06/16/2021  ? Malignant neoplasm of prostate ([place]) 02/24/2020  ? Renal calculus, right 05/01/2016  ? ? ?Procedures performed: ?Procedure(s): ?XI ROBOTIC ASSISTED LAPAROSCOPIC RADICAL PROSTATECTOMY LEVEL 2 ?LYMPHADENECTOMY, PELVIC ? ?History and Physical: For full details, please see admission history and physical. Briefly, Gregory [name] is a 61 y.o. year old patient with Hx of clinically localized prostate cancer.  ? ?Hospital Course: Patient tolerated the procedure well.  He was then transferred to the floor after an uneventful PACU stay.  His hospital course was uncomplicated.  On POD#1 he had met discharge criteria: was eating a regular diet, was up and ambulating independently,  pain was well controlled, was tolerating his catheter well, and was ready to for discharge. ? ? ?Laboratory values:  ?Recent Labs  ?  06/16/21 ?1058 06/17/21 ?0430  ?HGB 15.0 12.9*  ?HCT 44.4 37.7*  ? ?No results for input(s): NA, K, CL, CO2, GLUCOSE, BUN, CREATININE, CALCIUM in the last 72 hours. ?No results for input(s): LABPT, INR in the last 72 hours. ?No results for input(s): LABURIN in the last 72 hours. ?No results found for this or any previous visit. ? ?Disposition: Home ? ?Discharge instruction: The patient was instructed to be ambulatory but told to refrain from heavy lifting, strenuous activity, or driving. ? ?Discharge medications:  ?Allergies as of 06/17/2021   ? ?   Reactions  ? Tape Rash  ? hyperfixed tape caused redness and irritation   ? Latex   ? redness  ? Penicillins Itching, Swelling, Palpitations  ? Has patient had a PCN reaction causing immediate rash, facial/tongue/throat swelling, SOB or lightheadedness with hypotension:unsure ?Has patient had a PCN reaction causing  severe rash involving mucus membranes or skin necrosis:unsure ?Has patient had a PCN reaction that required hospitalization:No ?Has patient had a PCN reaction occurring within the last 10 years:No ?If all of the above answers are "NO", then may proceed with Cephalosporin use. ?Childhood reaction.  ? ?  ? ?  ?Medication List  ?  ? ?STOP taking these medications   ? ?APPLE CIDER VINEGAR PO ?  ?ELDERBERRY PO ?  ?multivitamin with minerals Tabs tablet ?  ?naproxen sodium 220 MG tablet ?Commonly known as: ALEVE ?  ?SUPER B COMPLEX PO ?  ?tamsulosin 0.4 MG Caps capsule ?Commonly known as: FLOMAX ?  ?TURMERIC PO ?  ? ?  ? ?TAKE these medications   ? ?acetaminophen 500 MG tablet ?Commonly known as: TYLENOL ?Take 1,000 mg by mouth every 6 (six) hours as needed for mild pain (for pain.). ?  ?allopurinol 300 MG tablet ?Commonly known as: ZYLOPRIM ?TAKE 1 TABLET BY MOUTH ONCE DAILY. APPT REQUIRED FOR FUTURE REFILLS ?What changed: See the new instructions. ?  ?Azelastine HCl 137 MCG/SPRAY Soln ?Place 1 spray into both nostrils in the morning. ?  ?calcium carbonate 500 MG chewable tablet ?Commonly known as: TUMS - dosed in mg elemental calcium ?Chew 2 tablets by mouth daily as needed for indigestion or heartburn. ?  ?cetirizine 10 MG tablet ?Commonly known as: ZYRTEC ?Take 10 mg by mouth daily as needed for allergies. ?  ?docusate sodium 100 MG capsule ?Commonly known as: COLACE ?Take 1 capsule (100 mg total) by mouth 2 (two) times daily. ?  ?esomeprazole 40 MG capsule ?Commonly known as: [place] ?Take 40  mg by mouth daily. ?  ?fluticasone 50 MCG/ACT nasal spray ?Commonly known as: FLONASE ?Place 1 spray into the nose at bedtime. ?  ?gabapentin 300 MG capsule ?Commonly known as: NEURONTIN ?Take 300 mg by mouth at bedtime. ?  ?methocarbamol 500 MG tablet ?Commonly known as: ROBAXIN ?Take 500 mg by mouth every 6 (six) hours as needed for muscle spasms. ?  ?montelukast 10 MG tablet ?Commonly known as: SINGULAIR ?Take 10 mg by mouth  at bedtime. ?  ?NON FORMULARY ?Pt uses a cpap nightly ?  ?sulfamethoxazole-trimethoprim 800-160 MG tablet ?Commonly known as: BACTRIM DS ?Take 1 tablet by mouth 2 (two) times daily. Start the day prior to foley removal appointment ?  ?traMADol 50 MG tablet ?Commonly known as: ULTRAM ?Take 1-2 tablets (50-100 mg total) by mouth every 6 (six) hours as needed for moderate pain or severe pain. ?What changed:  ?how much to take ?reasons to take this ?  ? ?  ? ? ?Followup:  ? Follow-up Information   ? ? Raynelle Bring, MD Follow up on 06/24/2021.   ?Specialty: Urology ?Why: at 12:30 ?Contact information: ?[address] ?[place] [address] ?[phone] ? ? ?  ?  ? ?  ?  ? ?  ? ? ? ?

## 2021-06-17 NOTE — Progress Notes (Signed)
?  Transition of Care (TOC) Screening Note ? ? ?Patient Details  ?Name: Gregory [name] ?Date of Birth: [birth year] ? ? ?Transition of Care [facility]) CM/SW Contact:    ?Dessa Phi, RN ?Phone Number: ?06/17/2021, 12:33 PM ? ? ? ?Transition of Care Department [facility]) has reviewed patient and no TOC needs have been identified at this time. We will continue to monitor patient advancement through interdisciplinary progression rounds. If new patient transition needs arise, please place a TOC consult. ?  ?

## 2021-06-17 NOTE — Plan of Care (Signed)

## 2021-06-17 NOTE — Progress Notes (Signed)
Patient ID: SHAFER SWAMY, male   DOB: 1960-06-27, 61 y.o.   MRN: [id number] ? ?1 Day Post-Op ?Subjective: ?The patient is doing well.  No nausea or vomiting. Pain is adequately controlled. ? ?Objective: ?Vital signs in last 24 hours: ?Temp:  [97.2 ?F (36.2 ?C)-99 ?F (37.2 ?C)] 98 ?F (36.7 ?C) (04/21 0531) ?Pulse Rate:  [55-100] 55 (04/21 0531) ?Resp:  [15-20] 18 (04/21 0531) ?BP: (113-157)/(60-88) 115/60 (04/21 0531) ?SpO2:  [95 %-100 %] 97 % (04/21 0531) ? ?Intake/Output from previous day: ?04/20 0701 - 04/21 0700 ?In: 3919.9 [P.O.:600; I.V.:3269.9; IV Piggyback:50] ?Out: 9518 [Urine:3550; Drains:195; Blood:350] ?Intake/Output this shift: ?No intake/output data recorded. ? ?Physical Exam:  ?General: Alert and oriented. ?CV: RRR ?Lungs: Clear bilaterally. ?GI: Soft, Nondistended. ?Incisions: Clean and dry. ?Urine: Clear ?Extremities: Nontender, no erythema, no edema. ? ?Lab Results: ?Recent Labs  ?  06/16/21 ?1058 06/17/21 ?0430  ?HGB 15.0 12.9*  ?HCT 44.4 37.7*  ? ?      No results for input(s): CREATININE in the last 168 hours. ? ?       ?Results for orders placed or performed during the hospital encounter of 06/16/21 (from the past 24 hour(s))  ?Hemoglobin and hematocrit, blood     Status: None  ? Collection Time: 06/16/21 10:58 AM  ?Result Value Ref Range  ? Hemoglobin 15.0 13.0 - 17.0 g/dL  ? HCT 44.4 39.0 - 52.0 %  ?Hemoglobin and hematocrit, blood     Status: Abnormal  ? Collection Time: 06/17/21  4:30 AM  ?Result Value Ref Range  ? Hemoglobin 12.9 (L) 13.0 - 17.0 g/dL  ? HCT 37.7 (L) 39.0 - 52.0 %  ? ? ?Assessment/Plan: ?POD# 1 s/p robotic partial nephrectomy. ? ?1) Ambulate, Incentive spirometry ?2) Advance diet as tolerated ?3) Transition to oral pain medication ?4) Dulcolax suppository ?5) D/C urethral catheter ? ? ?Pryor Curia MD ? ? LOS: 0 days  ? ?Les Alinda Money ?06/17/2021, 7:41 AM ? ? ?   ?

## 2021-06-21 ENCOUNTER — Encounter (HOSPITAL_COMMUNITY): Payer: Self-pay | Admitting: Urology

## 2021-06-23 LAB — SURGICAL PATHOLOGY

## 2021-07-11 DIAGNOSIS — M62838 Other muscle spasm: Secondary | ICD-10-CM | POA: Diagnosis not present

## 2021-07-11 DIAGNOSIS — M6281 Muscle weakness (generalized): Secondary | ICD-10-CM | POA: Diagnosis not present

## 2021-07-11 DIAGNOSIS — N393 Stress incontinence (female) (male): Secondary | ICD-10-CM | POA: Diagnosis not present

## 2021-08-03 DIAGNOSIS — M6281 Muscle weakness (generalized): Secondary | ICD-10-CM | POA: Diagnosis not present

## 2021-08-03 DIAGNOSIS — N393 Stress incontinence (female) (male): Secondary | ICD-10-CM | POA: Diagnosis not present

## 2021-08-03 DIAGNOSIS — M62838 Other muscle spasm: Secondary | ICD-10-CM | POA: Diagnosis not present

## 2021-09-27 DIAGNOSIS — C61 Malignant neoplasm of prostate: Secondary | ICD-10-CM | POA: Diagnosis not present

## 2021-10-05 DIAGNOSIS — N5201 Erectile dysfunction due to arterial insufficiency: Secondary | ICD-10-CM | POA: Diagnosis not present

## 2021-10-05 DIAGNOSIS — C61 Malignant neoplasm of prostate: Secondary | ICD-10-CM | POA: Diagnosis not present

## 2021-10-05 DIAGNOSIS — N393 Stress incontinence (female) (male): Secondary | ICD-10-CM | POA: Diagnosis not present

## 2022-01-05 DIAGNOSIS — G4733 Obstructive sleep apnea (adult) (pediatric): Secondary | ICD-10-CM | POA: Diagnosis not present

## 2022-01-05 DIAGNOSIS — J3089 Other allergic rhinitis: Secondary | ICD-10-CM | POA: Diagnosis not present

## 2022-02-02 DIAGNOSIS — G4733 Obstructive sleep apnea (adult) (pediatric): Secondary | ICD-10-CM | POA: Diagnosis not present

## 2022-02-03 DIAGNOSIS — R972 Elevated prostate specific antigen [PSA]: Secondary | ICD-10-CM | POA: Diagnosis not present

## 2022-02-03 DIAGNOSIS — R001 Bradycardia, unspecified: Secondary | ICD-10-CM | POA: Diagnosis not present

## 2022-02-10 DIAGNOSIS — Z1339 Encounter for screening examination for other mental health and behavioral disorders: Secondary | ICD-10-CM | POA: Diagnosis not present

## 2022-02-10 DIAGNOSIS — Z0000 Encounter for general adult medical examination without abnormal findings: Secondary | ICD-10-CM | POA: Diagnosis not present

## 2022-02-10 DIAGNOSIS — Z1331 Encounter for screening for depression: Secondary | ICD-10-CM | POA: Diagnosis not present

## 2022-02-10 DIAGNOSIS — C61 Malignant neoplasm of prostate: Secondary | ICD-10-CM | POA: Diagnosis not present

## 2022-02-10 DIAGNOSIS — Z23 Encounter for immunization: Secondary | ICD-10-CM | POA: Diagnosis not present

## 2022-02-17 DIAGNOSIS — G4733 Obstructive sleep apnea (adult) (pediatric): Secondary | ICD-10-CM | POA: Diagnosis not present

## 2022-03-05 DIAGNOSIS — G4733 Obstructive sleep apnea (adult) (pediatric): Secondary | ICD-10-CM | POA: Diagnosis not present

## 2022-03-20 DIAGNOSIS — G4733 Obstructive sleep apnea (adult) (pediatric): Secondary | ICD-10-CM | POA: Diagnosis not present

## 2022-04-05 DIAGNOSIS — G4733 Obstructive sleep apnea (adult) (pediatric): Secondary | ICD-10-CM | POA: Diagnosis not present

## 2022-04-19 DIAGNOSIS — C61 Malignant neoplasm of prostate: Secondary | ICD-10-CM | POA: Diagnosis not present

## 2022-04-20 DIAGNOSIS — G4733 Obstructive sleep apnea (adult) (pediatric): Secondary | ICD-10-CM | POA: Diagnosis not present

## 2022-04-26 DIAGNOSIS — C61 Malignant neoplasm of prostate: Secondary | ICD-10-CM | POA: Diagnosis not present

## 2022-05-04 DIAGNOSIS — G4733 Obstructive sleep apnea (adult) (pediatric): Secondary | ICD-10-CM | POA: Diagnosis not present

## 2022-06-04 DIAGNOSIS — G4733 Obstructive sleep apnea (adult) (pediatric): Secondary | ICD-10-CM | POA: Diagnosis not present

## 2022-07-04 DIAGNOSIS — G4733 Obstructive sleep apnea (adult) (pediatric): Secondary | ICD-10-CM | POA: Diagnosis not present

## 2022-08-04 DIAGNOSIS — G4733 Obstructive sleep apnea (adult) (pediatric): Secondary | ICD-10-CM | POA: Diagnosis not present

## 2022-09-03 DIAGNOSIS — G4733 Obstructive sleep apnea (adult) (pediatric): Secondary | ICD-10-CM | POA: Diagnosis not present

## 2022-10-04 DIAGNOSIS — G4733 Obstructive sleep apnea (adult) (pediatric): Secondary | ICD-10-CM | POA: Diagnosis not present

## 2022-11-04 DIAGNOSIS — G4733 Obstructive sleep apnea (adult) (pediatric): Secondary | ICD-10-CM | POA: Diagnosis not present

## 2022-11-17 DIAGNOSIS — C61 Malignant neoplasm of prostate: Secondary | ICD-10-CM | POA: Diagnosis not present

## 2022-11-24 DIAGNOSIS — C61 Malignant neoplasm of prostate: Secondary | ICD-10-CM | POA: Diagnosis not present

## 2023-12-03 ENCOUNTER — Encounter (HOSPITAL_BASED_OUTPATIENT_CLINIC_OR_DEPARTMENT_OTHER): Payer: Self-pay

## 2023-12-03 ENCOUNTER — Ambulatory Visit (HOSPITAL_BASED_OUTPATIENT_CLINIC_OR_DEPARTMENT_OTHER): Admission: EM | Admit: 2023-12-03 | Discharge: 2023-12-03 | Disposition: A | Discharge status: Home / Self Care

## 2023-12-03 ENCOUNTER — Other Ambulatory Visit (HOSPITAL_BASED_OUTPATIENT_CLINIC_OR_DEPARTMENT_OTHER): Payer: Self-pay

## 2023-12-03 DIAGNOSIS — H66001 Acute suppurative otitis media without spontaneous rupture of ear drum, right ear: Secondary | ICD-10-CM | POA: Diagnosis not present

## 2023-12-03 DIAGNOSIS — U071 COVID-19: Secondary | ICD-10-CM | POA: Diagnosis not present

## 2023-12-03 LAB — POC COVID19/FLU A&B COMBO
Covid Antigen, POC: NEGATIVE
Influenza A Antigen, POC: NEGATIVE
Influenza B Antigen, POC: NEGATIVE

## 2023-12-03 MED ORDER — CEFDINIR 300 MG PO CAPS
300.0000 mg | ORAL_CAPSULE | Freq: Two times a day (BID) | ORAL | 0 refills | Status: AC
  Filled 2023-12-03: qty 14, 7d supply, fill #0

## 2023-12-03 NOTE — ED Triage Notes (Signed)
 Onset 3 days ago of headache, runny nose, body aches, low grade temperature. Has been using Mucinex D, and robitussin for cough.

## 2023-12-03 NOTE — ED Provider Notes (Signed)
 [facility]    CSN: [id number] Arrival date & time: 12/03/23  1630      History   Chief Complaint Chief Complaint  Patient presents with   Cough   Nasal Congestion   Fever    HPI Gregory [name] is a 63 y.o. male.   Patient is a 63 year old male who presents today with viral type symptoms.  Onset 3 days ago of headache, runny nose, body aches, low grade temperature. Has been using Mucinex D, and robitussin for cough.  Today started with significant pain to the right ear and trouble hearing out of the ear.    Cough Associated symptoms: fever   Fever Associated symptoms: cough     Past Medical History:  Diagnosis Date   Arthritis    Bradycardia    Complication of anesthesia    bradycardia during a colonoscopy    Family history of adverse reaction to anesthesia    Father hard to intubate   Frequency of urination    GERD (gastroesophageal reflux disease)    H/O seasonal allergies    Headache    Sinus headaches   History of kidney stones    PONV (postoperative nausea and vomiting)    Prostate cancer (HCC)    Sleep apnea 2015   osa, cpap   Umbilical hernia     Patient Active Problem List   Diagnosis Date Noted   Prostate cancer (HCC) 06/16/2021   Malignant neoplasm of prostate (HCC) 02/24/2020   Renal calculus, right 05/01/2016    Past Surgical History:  Procedure Laterality Date   CYSTOSCOPY W/ RETROGRADES Right 05/01/2016   Procedure: CYSTOSCOPY WITH RETROGRADE PYELOGRAM;  Surgeon: Belvie LITTIE Clara, MD;  Location: [facility];  Service: Urology;  Laterality: Right;   CYSTOSCOPY/URETEROSCOPY/HOLMIUM LASER/STENT PLACEMENT Right 06/05/2016   Procedure: CYSTOSCOPY/RETROGRADE PYELOGRAM/URETEROSCOPY/HOLMIUM LASER/STENT EXCHANGE;  Surgeon: Belvie LITTIE Clara, MD;  Location: [facility];  Service: Urology;  Laterality: Right;   LITHOTRIPSY     x2   LYMPHADENECTOMY Bilateral 06/16/2021   Procedure: LYMPHADENECTOMY, PELVIC;  Surgeon: Renda Glance, MD;   Location: [facility];  Service: Urology;  Laterality: Bilateral;   NEPHROLITHOTOMY Right 05/01/2016   Procedure: NEPHROLITHOTOMY RIGHT PERCUTANEOUS WITH PHYSICIAN'S ACCESS,STENT;  Surgeon: Belvie LITTIE Clara, MD;  Location: [facility];  Service: Urology;  Laterality: Right;   PROSTATE BIOPSY     ROBOT ASSISTED LAPAROSCOPIC RADICAL PROSTATECTOMY N/A 06/16/2021   Procedure: XI ROBOTIC ASSISTED LAPAROSCOPIC RADICAL PROSTATECTOMY LEVEL 2;  Surgeon: Renda Glance, MD;  Location: [facility];  Service: Urology;  Laterality: N/A;   UMBILICAL HERNIA REPAIR N/A 12/12/2016   Procedure: UMBILICAL HERNIA REPAIR;  Surgeon: Vanderbilt Ned, MD;  Location: [facility];  Service: General;  Laterality: N/A;   urinary stent   01/27/2016   VASECTOMY         Home Medications    Prior to Admission medications   Medication Sig Start Date End Date Taking? Authorizing Provider  cefdinir (OMNICEF) 300 MG capsule Take 1 capsule (300 mg total) by mouth 2 (two) times daily for 7 days. 12/03/23 12/10/23 Yes Akacia Boltz A, FNP  acetaminophen  (TYLENOL ) 500 MG tablet Take 1,000 mg by mouth every 6 (six) hours as needed for mild pain (for pain.).     [provider]  allopurinol  (ZYLOPRIM ) 300 MG tablet TAKE 1 TABLET BY MOUTH ONCE DAILY. APPT REQUIRED FOR FUTURE REFILLS Patient taking differently: Take 300 mg by mouth at bedtime. 04/27/20   McKenzie, Belvie LITTIE, MD  Azelastine  HCl 137  MCG/SPRAY SOLN Place 1 spray into both nostrils in the morning. 10/22/19   [provider]  B Complex-C (B-COMPLEX WITH VITAMIN C) tablet Take 1 tablet by mouth daily.    [provider]  calcium carbonate (TUMS - DOSED IN MG ELEMENTAL CALCIUM) 500 MG chewable tablet Chew 2 tablets by mouth daily as needed for indigestion or heartburn.    [provider]  cetirizine (ZYRTEC) 10 MG tablet Take 10 mg by mouth daily as needed for allergies.    [provider]  docusate sodium  (COLACE) 100 MG capsule Take 1  capsule (100 mg total) by mouth 2 (two) times daily. 06/16/21   Cory Palma, PA-C  esomeprazole (NEXIUM) 40 MG capsule Take 40 mg by mouth daily. 01/13/20   [provider]  fluticasone  (FLONASE ) 50 MCG/ACT nasal spray Place 1 spray into the nose at bedtime.    [provider]  gabapentin  (NEURONTIN ) 300 MG capsule Take 300 mg by mouth at bedtime. 02/18/16   [provider]  methocarbamol  (ROBAXIN ) 500 MG tablet Take 500 mg by mouth every 6 (six) hours as needed for muscle spasms. 01/12/20   [provider]  montelukast  (SINGULAIR ) 10 MG tablet Take 10 mg by mouth at bedtime.    [provider]  NON FORMULARY Pt uses a cpap nightly    [provider]  sildenafil (VIAGRA) 100 MG tablet Take 100 mg by mouth as needed.    [provider]    Family History Family History  Problem Relation Age of Onset   Breast cancer Mother    Prostate cancer Father    Pancreatic cancer Maternal Aunt    Prostate cancer Paternal Grandfather    Ovarian cancer Maternal Aunt    Colon cancer Neg Hx     Social History Social History   Tobacco Use   Smoking status: Never   Smokeless tobacco: Never  Vaping Use   Vaping status: Never Used  Substance Use Topics   Alcohol  use: No   Drug use: No     Allergies   Tape, Latex, and Penicillins   Review of Systems Review of Systems  Constitutional:  Positive for fever.  Respiratory:  Positive for cough.      Physical Exam Triage Vital Signs ED Triage Vitals  Encounter Vitals Group     BP 12/03/23 1700 (!) 146/87     Girls Systolic BP Percentile --      Girls Diastolic BP Percentile --      Boys Systolic BP Percentile --      Boys Diastolic BP Percentile --      Pulse Rate 12/03/23 1700 (!) 56     Resp 12/03/23 1700 20     Temp 12/03/23 1700 98 F (36.7 C)     Temp Source 12/03/23 1700 Oral     SpO2 12/03/23 1700 96 %     Weight --      Height --      Head Circumference --       Peak Flow --      Pain Score 12/03/23 1702 7     Pain Loc --      Pain Education --      Exclude from Growth Chart --    No data found.  Updated Vital Signs BP (!) 146/87 (BP Location: Right Arm)   Pulse (!) 56   Temp 98 F (36.7 C) (Oral)   Resp 20   SpO2 96%   Visual  Acuity Right Eye Distance:   Left Eye Distance:   Bilateral Distance:    Right Eye Near:   Left Eye Near:    Bilateral Near:     Physical Exam Constitutional:      General: He is not in acute distress.    Appearance: Normal appearance. He is not ill-appearing, toxic-appearing or diaphoretic.  HENT:     Right Ear: Tympanic membrane is erythematous and bulging.     Left Ear: Tympanic membrane, ear canal and external ear normal.     Mouth/Throat:     Pharynx: Oropharynx is clear.  Eyes:     Conjunctiva/sclera: Conjunctivae normal.  Cardiovascular:     Rate and Rhythm: Normal rate and regular rhythm.     Pulses: Normal pulses.     Heart sounds: Normal heart sounds.  Pulmonary:     Effort: Pulmonary effort is normal.     Breath sounds: Wheezing present.     Comments: Mild expiratory wheezing throughout all lung fields. Musculoskeletal:        General: Normal range of motion.  Skin:    General: Skin is warm and dry.  Neurological:     Mental Status: He is alert.  Psychiatric:        Mood and Affect: Mood normal.      UC Treatments / Results  Labs (all labs ordered are listed, but only abnormal results are displayed) Labs Reviewed  POC COVID19/FLU A&B COMBO - Normal    EKG   Radiology No results found.  Procedures Procedures (including critical care time)  Medications Ordered in UC Medications - No data to display  Initial Impression / Assessment and Plan / UC Course  I have reviewed the triage vital signs and the nursing notes.  Pertinent labs & imaging results that were available during my care of the patient were reviewed by me and considered in my medical decision making (see  chart for details).     COVID-19-recommended symptomatic treatment over-the-counter for symptoms as needed.  Paxlovid not indicated at this time.  Otitis media of the right ear-treating with cefdinir.  Can take Tylenol  and ibuprofen for pain as needed.  Recommend allergy medication to include Zyrtec and Flonase  over-the-counter.  Follow-up as needed  Final Clinical Impressions(s) / UC Diagnoses   Final diagnoses:  COVID-19  Non-recurrent acute suppurative otitis media of right ear without spontaneous rupture of tympanic membrane     Discharge Instructions      Your COVID test was positive.  I am recommending over-the-counter symptomatic treatment for your symptoms as needed. Work note provided and can return on Thursday if feeling better and if fever free. Cefdinir for the ear infection. Follow-up for any worsening problems    ED Prescriptions     Medication Sig Dispense Auth. Provider   cefdinir (OMNICEF) 300 MG capsule Take 1 capsule (300 mg total) by mouth 2 (two) times daily for 7 days. 14 capsule Adah Corning A, FNP      PDMP not reviewed this encounter.   Adah Corning LABOR, FNP 12/03/23 1858

## 2023-12-03 NOTE — Discharge Instructions (Addendum)
 Your COVID test was positive.  I am recommending over-the-counter symptomatic treatment for your symptoms as needed. Work note provided and can return on Thursday if feeling better and if fever free. Cefdinir for the ear infection. Follow-up for any worsening problems

## 2023-12-15 ENCOUNTER — Encounter (HOSPITAL_BASED_OUTPATIENT_CLINIC_OR_DEPARTMENT_OTHER): Payer: Self-pay | Admitting: Emergency Medicine

## 2023-12-15 ENCOUNTER — Ambulatory Visit (HOSPITAL_BASED_OUTPATIENT_CLINIC_OR_DEPARTMENT_OTHER)
Admission: EM | Admit: 2023-12-15 | Discharge: 2023-12-15 | Disposition: A | Discharge status: Home / Self Care | Attending: Family Medicine | Admitting: Family Medicine

## 2023-12-15 DIAGNOSIS — J069 Acute upper respiratory infection, unspecified: Secondary | ICD-10-CM

## 2023-12-15 DIAGNOSIS — H66004 Acute suppurative otitis media without spontaneous rupture of ear drum, recurrent, right ear: Secondary | ICD-10-CM | POA: Diagnosis not present

## 2023-12-15 MED ORDER — PREDNISONE 20 MG PO TABS: 20.0000 mg | ORAL_TABLET | Freq: Every day | ORAL | 0 refills | Status: AC

## 2023-12-15 MED ORDER — AZITHROMYCIN 250 MG PO TABS: ORAL_TABLET | ORAL | 0 refills | Status: DC

## 2023-12-15 NOTE — ED Triage Notes (Signed)
 Pt c/o right ear pain and swelling in his right side of neck started 2 weeks ago. Pt was given antibiotic at last appointment on 10/6

## 2023-12-15 NOTE — Discharge Instructions (Addendum)
 Viral upper respiratory infection with cough and recurrent right ear infection: Prednisone 20 mg daily for 5 days.  Azithromycin 250 mg, 2 pills a day and then 1 pill daily for 4 days.  Take the azithromycin with a meal or after food.  Encouraged use of probiotics or eating yogurt to reduce risk of antibiotic related diarrhea.  Get plenty of fluids and rest.  Follow-up with primary care or return here if symptoms do not improve, if symptoms worsen or if new symptoms occur.

## 2023-12-15 NOTE — ED Provider Notes (Signed)
 Gregory [name]    CSN: [id number] Arrival date & time: 12/15/23  0828      History   Chief Complaint Chief Complaint  Patient presents with   Otalgia    HPI Gregory [name] is a 63 y.o. male.   63 year old male who was seen on 12/03/2023 with respiratory symptoms and ear pain.  He was started on cefdinir, 300 mg twice daily for right otitis media.  He is also positive for COVID per the notes and comfort measures were discussed.  He completed the antibiotic and he continues with right ear pain, swollen glands in the right side of his neck and he just does not feel well.  He reports some mild cough and some nasal congestion.  He denies fever, nausea, vomiting, constipation, diarrhea.   Otalgia Associated symptoms: congestion, cough and rhinorrhea   Associated symptoms: no abdominal pain, no diarrhea, no fever, no rash, no sore throat and no vomiting     Past Medical History:  Diagnosis Date   Arthritis    Bradycardia    Complication of anesthesia    bradycardia during a colonoscopy    Family history of adverse reaction to anesthesia    Father hard to intubate   Frequency of urination    GERD (gastroesophageal reflux disease)    H/O seasonal allergies    Headache    Sinus headaches   History of kidney stones    PONV (postoperative nausea and vomiting)    Prostate cancer (HCC)    Sleep apnea 2015   osa, cpap   Umbilical hernia     Patient Active Problem List   Diagnosis Date Noted   Prostate cancer (HCC) 06/16/2021   Malignant neoplasm of prostate (HCC) 02/24/2020   Renal calculus, right 05/01/2016    Past Surgical History:  Procedure Laterality Date   CYSTOSCOPY W/ RETROGRADES Right 05/01/2016   Procedure: CYSTOSCOPY WITH RETROGRADE PYELOGRAM;  Surgeon: Belvie LITTIE Clara, MD;  Location: [facility];  Service: Urology;  Laterality: Right;   CYSTOSCOPY/URETEROSCOPY/HOLMIUM LASER/STENT PLACEMENT Right 06/05/2016   Procedure: CYSTOSCOPY/RETROGRADE  PYELOGRAM/URETEROSCOPY/HOLMIUM LASER/STENT EXCHANGE;  Surgeon: Belvie LITTIE Clara, MD;  Location: [facility];  Service: Urology;  Laterality: Right;   LITHOTRIPSY     x2   LYMPHADENECTOMY Bilateral 06/16/2021   Procedure: LYMPHADENECTOMY, PELVIC;  Surgeon: Renda Glance, MD;  Location: [facility];  Service: Urology;  Laterality: Bilateral;   NEPHROLITHOTOMY Right 05/01/2016   Procedure: NEPHROLITHOTOMY RIGHT PERCUTANEOUS WITH PHYSICIAN'S ACCESS,STENT;  Surgeon: Belvie LITTIE Clara, MD;  Location: [facility];  Service: Urology;  Laterality: Right;   PROSTATE BIOPSY     ROBOT ASSISTED LAPAROSCOPIC RADICAL PROSTATECTOMY N/A 06/16/2021   Procedure: XI ROBOTIC ASSISTED LAPAROSCOPIC RADICAL PROSTATECTOMY LEVEL 2;  Surgeon: Renda Glance, MD;  Location: [facility];  Service: Urology;  Laterality: N/A;   UMBILICAL HERNIA REPAIR N/A 12/12/2016   Procedure: UMBILICAL HERNIA REPAIR;  Surgeon: Vanderbilt Ned, MD;  Location: [facility];  Service: General;  Laterality: N/A;   urinary stent   01/27/2016   VASECTOMY         Home Medications    Prior to Admission medications   Medication Sig Start Date End Date Taking? Authorizing Provider  azithromycin (ZITHROMAX) 250 MG tablet Take 2 tablets today after a meal or with food.  Then take 1 tablet after a meal or with food daily for 4 days. 12/15/23  Yes Ival Domino, FNP  predniSONE (DELTASONE) 20 MG tablet Take 1 tablet (20 mg total) by mouth daily  with breakfast for 5 days. 12/15/23 12/20/23 Yes Ival Domino, FNP  acetaminophen  (TYLENOL ) 500 MG tablet Take 1,000 mg by mouth every 6 (six) hours as needed for mild pain (for pain.).     [provider]  allopurinol  (ZYLOPRIM ) 300 MG tablet TAKE 1 TABLET BY MOUTH ONCE DAILY. APPT REQUIRED FOR FUTURE REFILLS Patient taking differently: Take 300 mg by mouth at bedtime. 04/27/20   McKenzie, Belvie CROME, MD  Azelastine  HCl 137 MCG/SPRAY SOLN Place 1 spray into both nostrils in the morning. 10/22/19    [provider]  B Complex-C (B-COMPLEX WITH VITAMIN C) tablet Take 1 tablet by mouth daily.    [provider]  calcium carbonate (TUMS - DOSED IN MG ELEMENTAL CALCIUM) 500 MG chewable tablet Chew 2 tablets by mouth daily as needed for indigestion or heartburn.    [provider]  cetirizine (ZYRTEC) 10 MG tablet Take 10 mg by mouth daily as needed for allergies.    [provider]  docusate sodium  (COLACE) 100 MG capsule Take 1 capsule (100 mg total) by mouth 2 (two) times daily. 06/16/21   Cory Palma, PA-C  esomeprazole (NEXIUM) 40 MG capsule Take 40 mg by mouth daily. 01/13/20   [provider]  fluticasone  (FLONASE ) 50 MCG/ACT nasal spray Place 1 spray into the nose at bedtime.    [provider]  gabapentin  (NEURONTIN ) 300 MG capsule Take 300 mg by mouth at bedtime. 02/18/16   [provider]  methocarbamol  (ROBAXIN ) 500 MG tablet Take 500 mg by mouth every 6 (six) hours as needed for muscle spasms. 01/12/20   [provider]  montelukast  (SINGULAIR ) 10 MG tablet Take 10 mg by mouth at bedtime.    [provider]  NON FORMULARY Pt uses a cpap nightly    [provider]  sildenafil (VIAGRA) 100 MG tablet Take 100 mg by mouth as needed.    [provider]    Family History Family History  Problem Relation Age of Onset   Breast cancer Mother    Prostate cancer Father    Pancreatic cancer Maternal Aunt    Prostate cancer Paternal Grandfather    Ovarian cancer Maternal Aunt    Colon cancer Neg Hx     Social History Social History   Tobacco Use   Smoking status: Never   Smokeless tobacco: Never  Vaping Use   Vaping status: Never Used  Substance Use Topics   Alcohol  use: No   Drug use: No     Allergies   Tape, Latex, and Penicillins   Review of Systems Review of Systems  Constitutional:  Negative for chills and fever.  HENT:  Positive for congestion, ear pain, postnasal  drip and rhinorrhea. Negative for sore throat.   Eyes:  Negative for pain and visual disturbance.  Respiratory:  Positive for cough.   Cardiovascular:  Negative for chest pain and palpitations.  Gastrointestinal:  Negative for abdominal pain, constipation, diarrhea, nausea and vomiting.  Genitourinary:  Negative for dysuria and hematuria.  Musculoskeletal:  Negative for arthralgias and back pain.  Skin:  Negative for color change and rash.  Neurological:  Negative for seizures and syncope.  Hematological:  Positive for adenopathy.  All other systems reviewed and are negative.    Physical Exam Triage Vital Signs ED Triage Vitals [12/15/23 0857]  Encounter Vitals Group     BP 136/82     Girls Systolic BP Percentile      Girls Diastolic BP Percentile  Boys Systolic BP Percentile      Boys Diastolic BP Percentile      Pulse Rate (!) 49     Resp 18     Temp 98.3 F (36.8 C)     Temp Source Oral     SpO2 92 %     Weight      Height      Head Circumference      Peak Flow      Pain Score 6     Pain Loc      Pain Education      Exclude from Growth Chart    No data found.  Updated Vital Signs BP 136/82 (BP Location: Right Arm)   Pulse (!) 49   Temp 98.3 F (36.8 C) (Oral)   Resp 18   SpO2 92%   Visual Acuity Right Eye Distance:   Left Eye Distance:   Bilateral Distance:    Right Eye Near:   Left Eye Near:    Bilateral Near:     Physical Exam Vitals and nursing note reviewed.  Constitutional:      General: He is not in acute distress.    Appearance: He is well-developed. He is not ill-appearing, toxic-appearing or diaphoretic.  HENT:     Head: Normocephalic and atraumatic.     Right Ear: Hearing, ear canal and external ear normal. A middle ear effusion is present. Tympanic membrane is bulging. Tympanic membrane is not erythematous.     Left Ear: Hearing, tympanic membrane, ear canal and external ear normal.     Nose: Congestion and rhinorrhea present.  Rhinorrhea is clear.     Right Sinus: No maxillary sinus tenderness or frontal sinus tenderness.     Left Sinus: No maxillary sinus tenderness or frontal sinus tenderness.     Mouth/Throat:     Lips: Pink.     Mouth: Mucous membranes are moist.     Pharynx: Uvula midline. No oropharyngeal exudate or posterior oropharyngeal erythema.     Tonsils: No tonsillar exudate.  Eyes:     Conjunctiva/sclera: Conjunctivae normal.     Pupils: Pupils are equal, round, and reactive to light.  Cardiovascular:     Rate and Rhythm: Normal rate and regular rhythm.     Heart sounds: S1 normal and S2 normal. No murmur heard. Pulmonary:     Effort: Pulmonary effort is normal. No respiratory distress.     Breath sounds: Examination of the right-lower field reveals decreased breath sounds. Examination of the left-lower field reveals decreased breath sounds. Decreased breath sounds present. No wheezing, rhonchi or rales.     Comments: No wheezing and no rhonchi but breath sounds are diminished in the bases and quiet throughout.  Oxygen  saturation is 93-94% on room air. Abdominal:     General: Bowel sounds are normal.     Palpations: Abdomen is soft.     Tenderness: There is no abdominal tenderness.  Musculoskeletal:        General: No swelling.     Cervical back: Neck supple.  Lymphadenopathy:     Head:     Right side of head: Tonsillar adenopathy present. No submental, submandibular, preauricular or posterior auricular adenopathy.     Left side of head: Tonsillar adenopathy present. No submental, submandibular, preauricular or posterior auricular adenopathy.     Cervical: Cervical adenopathy present.     Right cervical: Superficial cervical adenopathy present.     Left cervical: Superficial cervical adenopathy present.  Skin:  General: Skin is warm and dry.     Capillary Refill: Capillary refill takes less than 2 seconds.     Findings: No rash.  Neurological:     Mental Status: He is alert and  oriented to person, place, and time.  Psychiatric:        Mood and Affect: Mood normal.      UC Treatments / Results  Labs (all labs ordered are listed, but only abnormal results are displayed) Labs Reviewed - No data to display  EKG   Radiology No results found.  Procedures Procedures (including critical [name] time)  Medications Ordered in UC Medications - No data to display  Initial Impression / Assessment and Plan / UC Course  I have reviewed the triage vital signs and the nursing notes.  Pertinent labs & imaging results that were available during my [name] of the patient were reviewed by me and considered in my medical decision making (see chart for details).  Plan of [name]: Viral upper respiratory infection with cough and recurrent right ear infection: Prednisone 20 mg daily for 5 days.  Azithromycin 250 mg, 2 pills a day and then 1 pill daily for 4 days.  Take the azithromycin with a meal or after food.  Encouraged use of probiotics or eating yogurt to reduce risk of antibiotic related diarrhea.  Get plenty of fluids and rest.  Follow-up with primary [name] or return here if symptoms do not improve, if symptoms worsen or if new symptoms occur.  I reviewed the plan of [name] with the patient and/or the patient's guardian.  The patient and/or guardian had time to ask questions and acknowledged that the questions were answered.  I provided instruction on symptoms or reasons to return here or to go to an ER, if symptoms/condition did not improve, worsened or if new symptoms occurred.  Final Clinical Impressions(s) / UC Diagnoses   Final diagnoses:  Viral URI with cough  Recurrent acute suppurative otitis media of right ear without spontaneous rupture of tympanic membrane     Discharge Instructions      Viral upper respiratory infection with cough and recurrent right ear infection: Prednisone 20 mg daily for 5 days.  Azithromycin 250 mg, 2 pills a day and then 1 pill daily for  4 days.  Take the azithromycin with a meal or after food.  Encouraged use of probiotics or eating yogurt to reduce risk of antibiotic related diarrhea.  Get plenty of fluids and rest.  Follow-up with primary [name] or return here if symptoms do not improve, if symptoms worsen or if new symptoms occur.     ED Prescriptions     Medication Sig Dispense Auth. Provider   azithromycin (ZITHROMAX) 250 MG tablet Take 2 tablets today after a meal or with food.  Then take 1 tablet after a meal or with food daily for 4 days. 6 tablet Lynnox Girten, FNP   predniSONE (DELTASONE) 20 MG tablet Take 1 tablet (20 mg total) by mouth daily with breakfast for 5 days. 5 tablet Bellah Alia, FNP      PDMP not reviewed this encounter.   Ival Domino, FNP 12/15/23 [phone]

## 2024-01-04 ENCOUNTER — Other Ambulatory Visit (HOSPITAL_COMMUNITY): Payer: Self-pay

## 2024-01-04 ENCOUNTER — Other Ambulatory Visit (HOSPITAL_BASED_OUTPATIENT_CLINIC_OR_DEPARTMENT_OTHER): Payer: Self-pay

## 2024-01-04 ENCOUNTER — Ambulatory Visit (HOSPITAL_BASED_OUTPATIENT_CLINIC_OR_DEPARTMENT_OTHER)
Admission: EM | Admit: 2024-01-04 | Discharge: 2024-01-04 | Disposition: A | Discharge status: Home / Self Care | Attending: Family Medicine | Admitting: Family Medicine

## 2024-01-04 ENCOUNTER — Encounter (HOSPITAL_BASED_OUTPATIENT_CLINIC_OR_DEPARTMENT_OTHER): Payer: Self-pay

## 2024-01-04 DIAGNOSIS — H6591 Unspecified nonsuppurative otitis media, right ear: Secondary | ICD-10-CM | POA: Diagnosis not present

## 2024-01-04 MED ORDER — PREDNISONE 20 MG PO TABS
40.0000 mg | ORAL_TABLET | Freq: Every day | ORAL | 0 refills | Status: DC
  Filled 2024-01-04: qty 10, 5d supply, fill #0

## 2024-01-04 MED ORDER — PREDNISONE 20 MG PO TABS
40.0000 mg | ORAL_TABLET | Freq: Every day | ORAL | 0 refills | Status: AC
  Filled 2024-01-04: qty 10, 5d supply, fill #0

## 2024-01-04 NOTE — ED Triage Notes (Signed)
 Seen 10/6 and 10/18 for right ear pain. Frequent ear infections. Patient was told to wait 1 week after finishing antibiotics and if not improved, to return for re-check.

## 2024-01-04 NOTE — Discharge Instructions (Addendum)
 This is a middle ear effusion or fluid behind the eardrum.  This typically will resolve and for to 12 weeks on its on.  Recommendations are to do allergy medications like Zyrtec and Flonase  over-the-counter.  We will try another course of steroids. This may only temporarily help. If this does not resolve you will need to see ENT.

## 2024-01-04 NOTE — ED Provider Notes (Signed)
 Gregory Gregory [name]    CSN: [id number] Arrival date & time: 01/04/24  1654      History   Chief Complaint Chief Complaint  Patient presents with   Otalgia    HPI Gregory Gregory [name] is Gregory [name] 63 y.o. male.   Patient is Gregory [name] 63 year old male who presents today with continuous right ear issues.  Seen 10/6 and 10/18 for right ear pain. Frequent ear infections. 2 round of antibiotics.  Patient was told to wait 1 week after finishing antibiotics and if not improved, to return for re-check. He is not having any pain in the ear just congested and feels like fluid is in there.     Otalgia   Past Medical History:  Diagnosis Date   Arthritis    Bradycardia    Complication of anesthesia    bradycardia during Gregory [name] colonoscopy    Family history of adverse reaction to anesthesia    Father hard to intubate   Frequency of urination    GERD (gastroesophageal reflux disease)    H/O seasonal allergies    Headache    Sinus headaches   History of kidney stones    PONV (postoperative nausea and vomiting)    Prostate cancer (HCC)    Sleep apnea 2015   osa, cpap   Umbilical hernia     Patient Active Problem List   Diagnosis Date Noted   Prostate cancer (HCC) 06/16/2021   Malignant neoplasm of prostate (HCC) 02/24/2020   Renal calculus, right 05/01/2016    Past Surgical History:  Procedure Laterality Date   CYSTOSCOPY W/ RETROGRADES Right 05/01/2016   Procedure: CYSTOSCOPY WITH RETROGRADE PYELOGRAM;  Surgeon: Belvie LITTIE Clara, MD;  Location: [facility];  Service: Urology;  Laterality: Right;   CYSTOSCOPY/URETEROSCOPY/HOLMIUM LASER/STENT PLACEMENT Right 06/05/2016   Procedure: CYSTOSCOPY/RETROGRADE PYELOGRAM/URETEROSCOPY/HOLMIUM LASER/STENT EXCHANGE;  Surgeon: Belvie LITTIE Clara, MD;  Location: [facility];  Service: Urology;  Laterality: Right;   LITHOTRIPSY     x2   LYMPHADENECTOMY Bilateral 06/16/2021   Procedure: LYMPHADENECTOMY, PELVIC;  Surgeon: Renda Glance, MD;  Location: [facility];  Service:  Urology;  Laterality: Bilateral;   NEPHROLITHOTOMY Right 05/01/2016   Procedure: NEPHROLITHOTOMY RIGHT PERCUTANEOUS WITH PHYSICIAN'S ACCESS,STENT;  Surgeon: Belvie LITTIE Clara, MD;  Location: [facility];  Service: Urology;  Laterality: Right;   PROSTATE BIOPSY     ROBOT ASSISTED LAPAROSCOPIC RADICAL PROSTATECTOMY N/Gregory [name] 06/16/2021   Procedure: XI ROBOTIC ASSISTED LAPAROSCOPIC RADICAL PROSTATECTOMY LEVEL 2;  Surgeon: Renda Glance, MD;  Location: [facility];  Service: Urology;  Laterality: N/Gregory [name];   UMBILICAL HERNIA REPAIR N/Gregory [name] 12/12/2016   Procedure: UMBILICAL HERNIA REPAIR;  Surgeon: Vanderbilt Ned, MD;  Location: [facility];  Service: General;  Laterality: N/Gregory [name];   urinary stent   01/27/2016   VASECTOMY         Home Medications    Prior to Admission medications   Medication Sig Start Date End Date Taking? Authorizing Provider  acetaminophen  (TYLENOL ) 500 MG tablet Take 1,000 mg by mouth every 6 (six) hours as needed for mild pain (for pain.).     [provider]  allopurinol  (ZYLOPRIM ) 300 MG tablet TAKE 1 TABLET BY MOUTH ONCE DAILY. APPT REQUIRED FOR FUTURE REFILLS Patient taking differently: Take 300 mg by mouth at bedtime. 04/27/20   McKenzie, Belvie LITTIE, MD  Azelastine  HCl 137 MCG/SPRAY SOLN Place 1 spray into both nostrils in the morning. 10/22/19   [provider]  B Complex-C (B-COMPLEX WITH VITAMIN C) tablet Take 1 tablet by mouth daily.  [provider]  calcium carbonate (TUMS - DOSED IN MG ELEMENTAL CALCIUM) 500 MG chewable tablet Chew 2 tablets by mouth daily as needed for indigestion or heartburn.    [provider]  cetirizine (ZYRTEC) 10 MG tablet Take 10 mg by mouth daily as needed for allergies.    [provider]  docusate sodium  (COLACE) 100 MG capsule Take 1 capsule (100 mg total) by mouth 2 (two) times daily. 06/16/21   Cory Palma, PA-C  esomeprazole (NEXIUM) 40 MG capsule Take 40 mg by mouth daily. 01/13/20   [provider]  fluticasone  (FLONASE ) 50 MCG/ACT nasal spray Place 1 spray into the nose at bedtime.    [provider]  gabapentin  (NEURONTIN ) 300 MG capsule Take 300 mg by mouth at bedtime. 02/18/16   [provider]  methocarbamol  (ROBAXIN ) 500 MG tablet Take 500 mg by mouth every 6 (six) hours as needed for muscle spasms. 01/12/20   [provider]  montelukast  (SINGULAIR ) 10 MG tablet Take 10 mg by mouth at bedtime.    [provider]  NON FORMULARY Pt uses Gregory [name] cpap nightly    [provider]  predniSONE (DELTASONE) 20 MG tablet Take 2 tablets (40 mg total) by mouth daily with breakfast for 5 days. 01/04/24 01/09/24  Gregory Gregory [name], Gregory Gregory [name]  sildenafil (VIAGRA) 100 MG tablet Take 100 mg by mouth as needed.    [provider]    Family History Family History  Problem Relation Age of Onset   Breast cancer Mother    Prostate cancer Father    Pancreatic cancer Maternal Aunt    Prostate cancer Paternal Grandfather    Ovarian cancer Maternal Aunt    Colon cancer Neg Hx     Social History Social History   Tobacco Use   Smoking status: Never   Smokeless tobacco: Never  Vaping Use   Vaping status: Never Used  Substance Use Topics   Alcohol  use: No   Drug use: No     Allergies   Tape, Latex, and Penicillins   Review of Systems Review of Systems  HENT:  Positive for ear pain.      Physical Exam Triage Vital Signs ED Triage Vitals  Encounter Vitals Group     BP 01/04/24 1703 127/81     Girls Systolic BP Percentile --      Girls Diastolic BP Percentile --      Boys Systolic BP Percentile --      Boys Diastolic BP Percentile --      Pulse Rate 01/04/24 1703 (!) 53     Resp 01/04/24 1703 20     Temp 01/04/24 1703 97.9 F (36.6 C)     Temp Source 01/04/24 1703 Oral     SpO2 01/04/24 1703 95 %     Weight --      Height --      Head Circumference --      Peak Flow --      Pain Score 01/04/24 1704 8     Pain Loc --       Pain Education --      Exclude from Growth Chart --    No data found.  Updated Vital Signs BP 127/81 (BP Location: Right Arm)   Pulse (!) 53   Temp 97.9 F (36.6 C) (Oral)   Resp 20   SpO2 95%   Visual Acuity Right Eye Distance:   Left Eye Distance:   Bilateral Distance:  Right Eye Near:   Left Eye Near:    Bilateral Near:     Physical Exam Constitutional:      Appearance: Normal appearance.  HENT:     Right Ear: Gregory [name] middle ear effusion is present.     Left Ear: Tympanic membrane, ear canal and external ear normal.  Pulmonary:     Effort: Pulmonary effort is normal.  Musculoskeletal:        General: Normal range of motion.  Neurological:     Mental Status: He is alert.  Psychiatric:        Mood and Affect: Mood normal.      UC Treatments / Results  Labs (all labs ordered are listed, but only abnormal results are displayed) Labs Reviewed - No data to display  EKG   Radiology No results found.  Procedures Procedures (including critical [name] time)  Medications Ordered in UC Medications - No data to display  Initial Impression / Assessment and Plan / UC Course  I have reviewed the triage vital signs and the nursing notes.  Pertinent labs & imaging results that were available during my [name] of the patient were reviewed by me and considered in my medical decision making (see chart for details).     Middle ear effusion- Recommended start Zyrtec and Flonase  and zyrtec OTC.  Will prescribe another short course of prednisone to see if this may help.  Reassured that this can take up to 4 to 12 weeks to resolve on its own.  If not he will need to see ear nose and throat specialist. Final Clinical Impressions(s) / UC Diagnoses   Final diagnoses:  Fluid level behind tympanic membrane of right ear     Discharge Instructions      This is Gregory [name] middle ear effusion or fluid behind the eardrum.  This typically will resolve and for to 12 weeks on its on.   Recommendations are to do allergy medications like Zyrtec and Flonase  over-the-counter.  We will try another course of steroids. This may only temporarily help. If this does not resolve you will need to see ENT.      ED Prescriptions     Medication Sig Dispense Auth. Provider   predniSONE (DELTASONE) 20 MG tablet  (Status: Discontinued) Take 2 tablets (40 mg total) by mouth daily with breakfast for 5 days. 10 tablet Gregory Ehrler Gregory [name], Gregory Gregory [name]   predniSONE (DELTASONE) 20 MG tablet Take 2 tablets (40 mg total) by mouth daily with breakfast for 5 days. 10 tablet Gregory Wilbert LABOR, Gregory Gregory [name]      PDMP not reviewed this encounter.   Gregory Wilbert LABOR, Gregory Gregory [name] 01/05/24 [id number]

## 2024-04-02 ENCOUNTER — Encounter: Payer: Self-pay | Admitting: Internal Medicine
